# Patient Record
Sex: Female | Born: 1970 | Race: White | Hispanic: No | Marital: Married | Smoking: Never smoker
Health system: Southern US, Community
[De-identification: ages and names within clinical notes are randomized; demographics above are authoritative.]

## PROBLEM LIST (undated history)

## (undated) DIAGNOSIS — B019 Varicella without complication: Secondary | ICD-10-CM

## (undated) DIAGNOSIS — T7840XA Allergy, unspecified, initial encounter: Secondary | ICD-10-CM

## (undated) HISTORY — DX: Varicella without complication: B01.9

## (undated) HISTORY — DX: Allergy, unspecified, initial encounter: T78.40XA

---

## 2002-01-29 ENCOUNTER — Other Ambulatory Visit: Admission: RE | Admit: 2002-01-29 | Discharge: 2002-01-29 | Payer: Self-pay | Admitting: Gynecology

## 2003-02-10 ENCOUNTER — Other Ambulatory Visit: Admission: RE | Admit: 2003-02-10 | Discharge: 2003-02-10 | Payer: Self-pay | Admitting: Gynecology

## 2003-10-27 ENCOUNTER — Inpatient Hospital Stay (HOSPITAL_COMMUNITY): Admission: AD | Admit: 2003-10-27 | Discharge: 2003-10-31 | Payer: Self-pay | Admitting: Gynecology

## 2003-10-28 ENCOUNTER — Encounter (INDEPENDENT_AMBULATORY_CARE_PROVIDER_SITE_OTHER): Payer: Self-pay | Admitting: *Deleted

## 2003-12-07 ENCOUNTER — Other Ambulatory Visit: Admission: RE | Admit: 2003-12-07 | Discharge: 2003-12-07 | Payer: Self-pay | Admitting: Gynecology

## 2005-07-23 ENCOUNTER — Other Ambulatory Visit: Admission: RE | Admit: 2005-07-23 | Discharge: 2005-07-23 | Payer: Self-pay | Admitting: Gynecology

## 2005-12-03 ENCOUNTER — Encounter: Admission: RE | Admit: 2005-12-03 | Discharge: 2005-12-03 | Payer: Self-pay | Admitting: Gynecology

## 2006-01-24 ENCOUNTER — Inpatient Hospital Stay (HOSPITAL_COMMUNITY): Admission: RE | Admit: 2006-01-24 | Discharge: 2006-01-27 | Payer: Self-pay | Admitting: Gynecology

## 2006-01-24 ENCOUNTER — Encounter (INDEPENDENT_AMBULATORY_CARE_PROVIDER_SITE_OTHER): Payer: Self-pay | Admitting: Specialist

## 2006-03-05 ENCOUNTER — Other Ambulatory Visit: Admission: RE | Admit: 2006-03-05 | Discharge: 2006-03-05 | Payer: Self-pay | Admitting: Gynecology

## 2009-08-10 ENCOUNTER — Ambulatory Visit (HOSPITAL_COMMUNITY): Admission: RE | Admit: 2009-08-10 | Discharge: 2009-08-10 | Payer: Self-pay | Admitting: Obstetrics and Gynecology

## 2009-08-15 ENCOUNTER — Ambulatory Visit (HOSPITAL_COMMUNITY): Admission: AD | Admit: 2009-08-15 | Discharge: 2009-08-15 | Payer: Self-pay | Admitting: Obstetrics and Gynecology

## 2009-08-18 ENCOUNTER — Ambulatory Visit (HOSPITAL_COMMUNITY): Admission: RE | Admit: 2009-08-18 | Discharge: 2009-08-18 | Payer: Self-pay | Admitting: Obstetrics and Gynecology

## 2009-08-22 ENCOUNTER — Ambulatory Visit (HOSPITAL_COMMUNITY): Admission: RE | Admit: 2009-08-22 | Discharge: 2009-08-22 | Payer: Self-pay | Admitting: Obstetrics and Gynecology

## 2009-08-25 ENCOUNTER — Ambulatory Visit (HOSPITAL_COMMUNITY): Admission: RE | Admit: 2009-08-25 | Discharge: 2009-08-25 | Payer: Self-pay | Admitting: Obstetrics and Gynecology

## 2009-08-29 ENCOUNTER — Ambulatory Visit (HOSPITAL_COMMUNITY): Admission: RE | Admit: 2009-08-29 | Discharge: 2009-08-29 | Payer: Self-pay | Admitting: Obstetrics and Gynecology

## 2009-08-31 ENCOUNTER — Encounter (INDEPENDENT_AMBULATORY_CARE_PROVIDER_SITE_OTHER): Payer: Self-pay | Admitting: Obstetrics and Gynecology

## 2009-08-31 ENCOUNTER — Inpatient Hospital Stay (HOSPITAL_COMMUNITY): Admission: RE | Admit: 2009-08-31 | Discharge: 2009-09-03 | Payer: Self-pay | Admitting: Obstetrics and Gynecology

## 2010-05-14 ENCOUNTER — Encounter: Payer: Self-pay | Admitting: Obstetrics and Gynecology

## 2010-07-11 LAB — BASIC METABOLIC PANEL
CO2: 24 mEq/L (ref 19–32)
Calcium: 9.3 mg/dL (ref 8.4–10.5)
Chloride: 104 mEq/L (ref 96–112)
Creatinine, Ser: 0.64 mg/dL (ref 0.4–1.2)
GFR calc non Af Amer: >60 mL/min (ref >60)
Glucose, Bld: 106 mg/dL — ABNORMAL HIGH (ref 70–99)
Potassium: 4 mEq/L (ref 3.5–5.1)

## 2010-07-11 LAB — CBC
Hemoglobin: 9.7 g/dL — ABNORMAL LOW (ref 12.0–15.0)
MCHC: 34.7 g/dL (ref 30.0–36.0)
MCHC: 34.8 g/dL (ref 30.0–36.0)
MCV: 86.3 fL (ref 78.0–100.0)
MCV: 87.4 fL (ref 78.0–100.0)
Platelets: 158 10*3/uL (ref 150–400)
RBC: 3.18 MIL/uL — ABNORMAL LOW (ref 3.87–5.11)

## 2010-07-11 LAB — TYPE AND SCREEN: Antibody Screen: NEGATIVE

## 2010-09-08 NOTE — Op Note (Signed)
NAME:  Caitlin Potter, Caitlin Potter                           ACCOUNT NO.:  [id number]   MEDICAL RECORD NO.:  [id number]                   PATIENT TYPE:  INP   LOCATION:  9119                                 FACILITY:  [facility]   PHYSICIAN:  Ivor Costa. Farrel Gobble, M.D.              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   POSTOPERATIVE DIAGNOSES:  Cord prolapse and LOT arrest.   PROCEDURE:  Stat primary cesarean section low flap transverse.   SURGEON:  Ivor Costa. Farrel Gobble, M.D.   ANESTHESIA:  Epidural IV fluid, 1 liter lactated Ringer's.   ESTIMATED BLOOD LOSS:  400 mL.   URINE OUTPUT:  175 mL of blood tinged urine.   FINDINGS:  Viable female infant, Apgar 8 & 9, birth weight pending, pH  arterial was clotted, pH venous was 733, normal uterus, tubes and ovaries.   PATHOLOGY:  Placenta.   COMPLICATIONS:  None.   DESCRIPTION OF PROCEDURE:  The patient had been taken to the operating room  for a primary cesarean section for an LOT arrest that was deep in the  pelvis.  Once adequate anesthesia was ensured, I came in in order to elevate  the head in order to aid in delivery via the cesarean section. The head was  able to be elevated; however, lateral to the head I felt the cord,  pulsations were not felt by me.  The heart rate had been reactive prior to  the patient coming into the OR.  A doptone was placed on, a heart rate of  105 was auscultated; however, because no pulsations were felt by me, we went  ahead rapidly prepped her by putting Betadine on the belly, wiping it off  and then placing the drape and proceeding with the cesarean section.  A  Pfannenstiel skin incision was made with the scalpel, carried through the  underlying layer of fascia which was scored in the midline and extended  laterally with the Mayo scissors.  The  inferior aspect of the fascial  incision was grasped with the Kocher's, the underlying rectus muscles were  dissected off by blunt and sharp dissection.  In a similar fashion, the  superior aspect of the incision was grasped with Kocher's, the underlying  rectus muscles were dissected off, the rectus muscles were separated in the  midline, the peritoneum was entered bluntly and then extended laterally. The  bladder blade was inserted, the vesicouterine peritoneum was identified,  tinted up and entered sharply with the Metzenbaum scissors. The incision was  extended laterally, the bladder flap was created digitally, bladder blade  was then reinserted in the lower uterine and incised in a transverse fashion  with a scalpel. The infant was able to be delivered with the aid of [place].  The cord was cut and clamped and handed off to the waiting pediatrician,  spontaneous cry and respiration was noted.  Cord arterial pH was attempted;  however, very little blood was achieved but was sent. Cord venous pH was  also done. Cord bloods were then obtained. The placenta was allowed to  separate naturally, the uterus was cleared of all clots and debris. The  uterine incision was repaired with a running locked layer of #0 chromic and  noted to be hemostatic afterwards. The pelvis was then irrigated with  copious amounts of warm saline. The adnexa were inspected, the incision was  noted to be hemostatic. The urine at this point was noted to have some blood  in it.  We never dropped the peritoneum inferiorly because of the stat  nature of the cesarean section. The Foley balloon was palpated approximately  5 or 6 cm below the peritoneal incision and it was felt to be secondary to  trauma. The peritoneum muscles and fascia were inspected and noted to be  hemostatic. The fascia was closed with #0 Vicryl in a running fashion, subcu  was irrigated, the skin was closed with staples. A pressure dressing was   placed.  The patient tolerated the procedure well. Sponge, sharp and needle  counts were correct x2.  She was transferred to the PACU in stable  condition.                                               Ivor Costa. Farrel Gobble, M.D.    Leda Roys  D:  10/28/2003  T:  10/28/2003  Job:  [id number]

## 2010-09-08 NOTE — Discharge Summary (Signed)
NAME:  [name], [name]                           ACCOUNT NO.:  [id number]   MEDICAL RECORD NO.:  [id number]                   PATIENT TYPE:  INP   LOCATION:  9119                                 [facility]ACILITY:  [facility]   PHYSICIAN:  Ivor Costa. Farrel Gobble, M.D.              DATE OF BIRTH:  1970/07/06   DATE OF ADMISSION:  10/27/2003  DATE OF DISCHARGE:  10/31/2003                                 DISCHARGE SUMMARY   PRINCIPAL DIAGNOSIS:  Primary cesarean section.   ADDITIONAL DIAGNOSES:  1. Cord prolapse.  2. Transverse arrest.   HOSPITAL COURSE:  The patient was admitted in the evening of October 27, 2003  complaining of low back pain and cramping.  She was noted to be 3, 80, and -  1 in the office and was transferred to L&D.  Her contractions were every 2-6  minutes.  She rapidly progressed to fully dilated in due fashion and began  pushing.  At the point when she was pushing approximately 2 hours the nurses  had called for assessment and I had noted that there was no change in  station.  Exam at that point showed that she was direct LOT at a +1 to +2  presentation.  We decided to proceed with a primary cesarean section.  The  patient was ultimately taken to the OR and anesthetized adequately for her  cesarean section, at which point I placed my hand in the vagina in order to  elevate the fetal head to aid in cesarean delivery, at which point a cord  prolapse occurred.  At that point no pulsations were noted.  I was able to  raise the head a little higher and push the cord up and the patient then  underwent a STAT primary cesarean section, low flap transverse, under  epidural anesthesia.  The findings was a viable female with Apgars of 8 and  9 and a birth weight of 7 pounds even.  A cord pH arterial clotted; however,  venous was 7.33.  The tubes and ovaries were unremarkable and the placenta  was sent to pathology.  Her postoperative course was unremarkable.  She had  requested and was discharged home  on postoperative day #3.  She was  ambulating without difficulty and she was given a prescription for Darvocet-  N 100 and Motrin for postoperative pain and instructed to follow up with [place]  in the office afterwards.  Her postoperative hemoglobin was 11.2 with  hematocrit of 33.7 and platelets of 198.                                               Ivor Costa. Farrel Gobble, M.D.    THL/MEDQ  D:  12/02/2003  T:  12/02/2003  Job:  [id number]

## 2010-09-08 NOTE — H&P (Signed)
NAME:  Caitlin Potter, Caitlin Potter               ACCOUNT NO.:  [id number]   MEDICAL RECORD NO.:  [id number]          PATIENT TYPE:  INP   LOCATION:  NA                            [facility]   PHYSICIAN:  Juan H. Lily Peer, M.D.DATE OF BIRTH:  12-14-1970   DATE OF ADMISSION:  DATE OF DISCHARGE:                                HISTORY & PHYSICAL   CHIEF COMPLAINT:  1. Term intrauterine pregnancy.  2. Previous cesarean section, for elective repeat.  3. Gestational diabetes, diet controlled.   HISTORY:  The patient is a 40 year old, gravida 2, para 1, with a corrected  estimated date of confinement February 04, 2006.  The patient will be 38-1/2  weeks as admitting gestational age at the time of planned repeat cesarean  section.  The patient with prior history of cesarean section in 2005 and  elected to proceed with an elective repeat instead of a trial of labor.  The  patient had a normal first trimester screening alpha-fetoprotein and  declines cystic fibrosis testing and has had otherwise an uneventful  prenatal course with the exception of gestational diabetes, which was diet  controlled.  The patient was followed with serial nonstress tests on a  weekly basis since [redacted] weeks gestation with a reassuring fetal heart rate  tracing, and her last ultrasound September 27, fetus was in the vertex  presentation and estimated fetal weight was 6 pounds 8 ounces in the 30th  percentile and normal AFI.   PAST MEDICAL HISTORY:  The patient with prior cesarean section.  The patient  with history of gestational diabetes, diet controlled.   ALLERGIES:  CODEINE and SULFA.   REVIEW OF SYSTEMS:  See [place] form.   PHYSICAL EXAMINATION:  Blood pressure 110/68.  Urine was negative for  protein and glucose.  Weight 154 pounds.  HEENT:  Unremarkable.  NECK:  Supple.  Trachea midline.  No carotid bruits.  No thyromegaly.  LUNGS:  Clear to auscultation without any rhonchi or wheezes.  HEART:  Regular rate and  rhythm.  No murmurs or gallops.  BREASTS:  Not done.  ABDOMEN:  Soft and nontender without rebound or guarding.  Vertex  presentation by [facility] maneuver.  PELVIC:  Cervix 1 cm, 70% effaced, -3 station.  EXTREMITIES:  DTR 1+.  No clonus.   PRENATAL LABS:  A positive blood type.  Negative antibody screening.  VDRL  was nonreactive.  Rubella immune.  Hepatitis B surface antigen and HIV were  negative.  Alpha-fetoprotein was normal.  Abnormal diabetes screen.  GBS  culture was negative.   ASSESSMENT:  A 40 year old, gravida 2, para 1, at 87 and 5/[redacted] week gestation  with previous cesarean section requesting elective repeat.  Gestational  diabetic, diet controlled.  Normal antepartum testing.  Risks, benefits, and  pros and cons of repeat cesarean section were discussed with the patient to  include infection, bleeding, trauma to internal organ, potential need for  blood products with a potential risk of anaphylactic hepatitis, and these  were discussed.  Also the risk of deep venous thrombosis and pulmonary  embolism, and also in a  rare event the possibility of uncontrollable  hemorrhage, and she will need a total hysterectomy as a life saving measure.  All of these issues were discussed with the patient.  All questions were  answered and will follow accordingly.   PLAN:  The patient scheduled for a repeat cesarean section on Thursday,  October 4 at 9:30 a.m. at [facility].  Please have history and physical  available.      Juan H. Lily Peer, M.D.  Electronically Signed     JHF/MEDQ  D:  01/23/2006  T:  01/23/2006  Job:  [id number]

## 2010-09-08 NOTE — Op Note (Signed)
NAME:  Caitlin Potter, Caitlin Potter               ACCOUNT NO.:  [id number]   MEDICAL RECORD NO.:  [id number]          PATIENT TYPE:  INP   LOCATION:  9198                          FACILITY:  [facility]   PHYSICIAN:  Juan H. Lily Peer, M.D.DATE OF BIRTH:  December 14, 1970   DATE OF PROCEDURE:  01/24/2006  DATE OF DISCHARGE:                                 OPERATIVE REPORT   INDICATIONS FOR OPERATION:  A 40 year old gravida 2, para 1, at 38-1/2 weeks  as main gestational age.  Gestational diabetic, diet control.  Previous  cesarean section in 2005.  Patient elected to proceed with a repeat cesarean  section.  ACOG guidelines had previously been discussed.  Risks, benefits,  and pro's and con's of trial labor of C-section had been discussed.   PREOPERATIVE DIAGNOSIS:  1. Term intrauterine pregnancy.  2. Gestational diabetes, diet control.  3. Previous cesarean section, requesting elective repeat.   POSTOPERATIVE DIAGNOSES:  1. Term intrauterine pregnancy.  2. Gestational diabetes, diet control.  3. Previous cesarean section, requesting elective repeat.   ANESTHESIA:  Spinal.   SURGEON:  Juan H. Lily Peer, M.D.   FIRST ASSISTANT:  Timothy P. Fontaine, M.D.   PROCEDURE PERFORMED:  Repeat lower uterine segment transverse cesarean  section.   FINDINGS:  Viable female infant, nuchal cord x2, clear amniotic fluid.  Apgars  are 9 and 9.  Arterial core pH was 7.34.  Weight is 7 pounds, 8 ounces.  Normal maternal pelvic anatomy.   DESCRIPTION OF OPERATION:  After the patient was adequately counseled, she  was taken to the operating room, where she underwent a successful spinal  placement.  Foley catheter was inserted and after monitoring urinary output,  the abdomen was prepped and draped in the usual sterile fashion.  A  Pfannenstiel skin incision was made 2 cm above the symphysis pubis.  The  incision was carried down through the skin and subcutaneous tissue, down to  the rectus fascia.  A midline nick was  made.  The fascia was incised in a  transverse fashion.  The midline raphe was entered.  The peritoneal cavity  was entered cautiously.  A bladder flap was established.  The lower uterine  segment was incised in a transverse fashion.  Clear amniotic fluid was  present.  The newborn's head was delivered.  The nuchal cord was manually  reduced.  It was around the baby's neck x2 and loose.  The nasopharyngeal  area was bulb-suctioned.  The newborn was delivered.  The cord was doubly  clamped and excised.  The newborn gave an immediate cry, was shown to the  parents and passed off to the neonatologist, who was in attendance, who gave  the above-mentioned parameters.  After cord blood was obtained, the placenta  and cord were passed off the operative field for histological evaluation.  The uterus was then exteriorized.  The intrauterine cavity was felt clear of  any products of conception.  The uterus was closed in a double locking  stitch fashion with 0 Vicryl followed by a second layer of 0 Vicryl suture.  The uterus was placed back in  the pelvic cavity.  The pelvic cavity was  copiously irrigated with normal saline solution.  Sponge count and needle  count were correct.  The perineum was not reapproximated but  the rectus  fascia was closed with a running stitch of 0 Vicryl suture.  Subcutaneous  bleeders were Bovie cauterized.  The skin was reapproximated with skin  clips, followed by placement of Xeroform gauze and 4x8  dressing.  The  patient was transferred to the recovery room with stable vital signs.  Blood  loss was reported at 600 cc, IV fluids 3000 cc, and urine output was 100 cc.      Juan H. Lily Peer, M.D.  Electronically Signed     JHF/MEDQ  D:  01/24/2006  T:  01/25/2006  Job:  [id number]

## 2010-09-08 NOTE — Discharge Summary (Signed)
NAME:  Caitlin Potter, Caitlin Potter               ACCOUNT NO.:  [id number]   MEDICAL RECORD NO.:  [id number]          PATIENT TYPE:  INP   LOCATION:  9109                          [facility]AC[facility]   PHYSICIAN:  Timothy P. Fontaine, M.D.DATE OF BIRTH:  04/14/1971   DATE OF ADMISSION:  01/24/2006  DATE OF DISCHARGE:  01/27/2006                                 DISCHARGE SUMMARY   DISCHARGE DIAGNOSES:  1. Pregnancy at term.  2. Prior cesarean section.  3. For repeat cesarean section.   PROCEDURE:  Repeat low transverse cervical cesarean section January 24, 2006,  Dr. Reynaldo Minium. Pathology pending.   HOSPITAL COURSE:  A 40 year old G5, P42 female at term gestation admitted for  repeat cesarean section with history of prior cesarean section. The patient  underwent uncomplicated repeat low transverse cervical cesarean section  January 24, 2006 producing normal female infant, Apgars 9 and 9. The patient's  postoperative course was uncomplicated. She was discharged on postoperative  day #3 ambulating well, tolerating a regular diet with a postoperative  hemoglobin of 10.8. The patient's blood type is AB positive, and rubella  titer was equivocal. The patient will receive a rubella vaccine prior to  discharge. She received precautions, instructions, and follow-up. Will be  seen in the office in six weeks. Received a prescription for Tylox #30, 1-2  p.o. q.4-6 h. p.r.n. pain.      Timothy P. Fontaine, M.D.  Electronically Signed     TPF/MEDQ  D:  01/27/2006  T:  01/28/2006  Job:  [id number]

## 2012-02-11 ENCOUNTER — Other Ambulatory Visit: Payer: Self-pay | Admitting: Obstetrics and Gynecology

## 2012-02-11 DIAGNOSIS — Z1231 Encounter for screening mammogram for malignant neoplasm of breast: Secondary | ICD-10-CM

## 2012-02-26 ENCOUNTER — Ambulatory Visit: Payer: Self-pay

## 2012-03-11 ENCOUNTER — Ambulatory Visit: Payer: Self-pay

## 2012-07-24 ENCOUNTER — Other Ambulatory Visit: Payer: Self-pay

## 2012-07-24 ENCOUNTER — Other Ambulatory Visit: Payer: Self-pay | Admitting: Pediatrics

## 2012-07-24 DIAGNOSIS — M542 Cervicalgia: Secondary | ICD-10-CM

## 2013-05-22 ENCOUNTER — Other Ambulatory Visit: Payer: Self-pay

## 2013-05-22 DIAGNOSIS — Z1231 Encounter for screening mammogram for malignant neoplasm of breast: Secondary | ICD-10-CM

## 2013-05-25 ENCOUNTER — Ambulatory Visit
Admission: RE | Admit: 2013-05-25 | Discharge: 2013-05-25 | Disposition: A | Discharge status: Home / Self Care | Payer: Self-pay | Source: Ambulatory Visit

## 2013-05-25 DIAGNOSIS — Z1231 Encounter for screening mammogram for malignant neoplasm of breast: Secondary | ICD-10-CM

## 2013-08-27 LAB — RESULTS CONSOLE HPV: CHL HPV: NEGATIVE

## 2013-12-21 ENCOUNTER — Emergency Department (HOSPITAL_BASED_OUTPATIENT_CLINIC_OR_DEPARTMENT_OTHER)
Admission: EM | Admit: 2013-12-21 | Discharge: 2013-12-21 | Disposition: A | Discharge status: Home / Self Care | Payer: BC Managed Care – PPO | Attending: Emergency Medicine | Admitting: Emergency Medicine

## 2013-12-21 ENCOUNTER — Emergency Department (HOSPITAL_BASED_OUTPATIENT_CLINIC_OR_DEPARTMENT_OTHER): Payer: BC Managed Care – PPO

## 2013-12-21 ENCOUNTER — Encounter (HOSPITAL_BASED_OUTPATIENT_CLINIC_OR_DEPARTMENT_OTHER): Payer: Self-pay | Admitting: Emergency Medicine

## 2013-12-21 DIAGNOSIS — R1310 Dysphagia, unspecified: Secondary | ICD-10-CM

## 2013-12-21 DIAGNOSIS — R6889 Other general symptoms and signs: Secondary | ICD-10-CM | POA: Insufficient documentation

## 2013-12-21 NOTE — Discharge Instructions (Signed)
RETURN TO THE ED IF THERE IS ANY INABILITY TO SWALLOW, SEVERE PAIN OR NEW CONCERN.

## 2013-12-21 NOTE — ED Provider Notes (Signed)
CSN: [id number]     Arrival date & time 12/21/13  1731 History   First MD Initiated Contact with Patient 12/21/13 1838     Chief Complaint  Patient presents with  . Foreign Body     (Consider location/radiation/quality/duration/timing/severity/associated sxs/prior Treatment) Patient is a 43 y.o. female presenting with foreign body. The history is provided by the patient. No language interpreter was used.  Foreign Body Location:  Swallowed Suspected object:  Food Pain quality:  Sharp Associated symptoms: no cough and no trouble swallowing   Associated symptoms comment:  She was eating fish last night and feels there is a probable fish bone in her throat. No cough or oropharyngeal bleeding. She has been able to eat and drink throughout today. She reports the pain she feels is sharp and seems to move in her throat to different locations.    History reviewed. No pertinent past medical history. Past Surgical History  Procedure Laterality Date  . Cesarean section     No family history on file. History  Substance Use Topics  . Smoking status: Never Smoker   . Smokeless tobacco: Not on file  . Alcohol Use: No   OB History   Grav Para Term Preterm Abortions TAB SAB Ect Mult Living                 Review of Systems  HENT: Negative for trouble swallowing.        See HPI.  Respiratory: Negative for cough and shortness of breath.       Allergies  Vicodin  Home Medications   Prior to Admission medications   Not on File   BP 118/83  Pulse 78  Temp(Src) 98.3 F (36.8 C) (Oral)  Resp 16  Ht  (1.727 m)  Wt 135 lb (61.236 kg)  BMI 20.53 kg/m2  SpO2 100% Physical Exam  Constitutional: She is oriented to person, place, and time. She appears well-developed and well-nourished. No distress.  HENT:  Mouth/Throat: Oropharynx is clear and moist.  Neck: Normal range of motion. Neck supple. No tracheal deviation present. No thyromegaly present.  Pulmonary/Chest: Effort  normal.  Lymphadenopathy:    She has no cervical adenopathy.  Neurological: She is alert and oriented to person, place, and time.    ED Course  Procedures (including critical care time) Labs Review Labs Reviewed - No data to display  Imaging Review Dg Neck Soft Tissue  12/21/2013   CLINICAL DATA:  Eight fish last night. Feels like a fish bone is stuck in the neck.  EXAM: NECK SOFT TISSUES - 1+ VIEW  COMPARISON:  None.  FINDINGS: There is no evidence of retropharyngeal soft tissue swelling or epiglottic enlargement. The cervical airway is unremarkable and no radio-opaque foreign body identified. No evidence of a retained fishbone.  IMPRESSION: Negative.   Electronically Signed   By: Amie Portland M.D.   On: 12/21/2013 19:05     EKG Interpretation None      MDM   Final diagnoses:  None    1. Odynophagia  No apparent compromise with swallowing or breathing. Will refer to ENT for further management.     Arnoldo Hooker, PA-C 12/21/13 1931

## 2013-12-21 NOTE — ED Notes (Signed)
She feels like she has a fish bone stuck in her throat.

## 2013-12-23 NOTE — ED Provider Notes (Signed)
Medical screening examination/treatment/procedure(s) were performed by non-physician practitioner and as supervising physician I was immediately available for consultation/collaboration.    Jemal Miskell L Eiman Maret, MD 12/23/13 2344 

## 2014-02-17 ENCOUNTER — Ambulatory Visit: Payer: BC Managed Care – PPO | Admitting: Family

## 2014-03-09 ENCOUNTER — Ambulatory Visit: Payer: BC Managed Care – PPO | Admitting: Family

## 2014-03-22 ENCOUNTER — Ambulatory Visit: Payer: BC Managed Care – PPO | Admitting: Family

## 2016-06-14 DIAGNOSIS — L255 Unspecified contact dermatitis due to plants, except food: Secondary | ICD-10-CM | POA: Diagnosis not present

## 2017-09-01 DIAGNOSIS — J029 Acute pharyngitis, unspecified: Secondary | ICD-10-CM | POA: Diagnosis not present

## 2017-12-24 ENCOUNTER — Ambulatory Visit (INDEPENDENT_AMBULATORY_CARE_PROVIDER_SITE_OTHER): Payer: BLUE CROSS/BLUE SHIELD | Admitting: Nurse Practitioner

## 2017-12-24 ENCOUNTER — Encounter: Payer: Self-pay | Admitting: Nurse Practitioner

## 2017-12-24 VITALS — BP 120/78 | HR 78 | Temp 98.1°F | Ht 69.0 in | Wt 144.0 lb

## 2017-12-24 DIAGNOSIS — Z13220 Encounter for screening for lipoid disorders: Secondary | ICD-10-CM | POA: Diagnosis not present

## 2017-12-24 DIAGNOSIS — Z0000 Encounter for general adult medical examination without abnormal findings: Secondary | ICD-10-CM

## 2017-12-24 DIAGNOSIS — Z136 Encounter for screening for cardiovascular disorders: Secondary | ICD-10-CM | POA: Diagnosis not present

## 2017-12-24 LAB — CBC
HEMATOCRIT: 41.4 % (ref 36.0–46.0)
Hemoglobin: 14 g/dL (ref 12.0–15.0)
MCHC: 33.8 g/dL (ref 30.0–36.0)
MCV: 90.3 fl (ref 78.0–100.0)
Platelets: 201 10*3/uL (ref 150.0–400.0)
RBC: 4.58 Mil/uL (ref 3.87–5.11)
RDW: 12.7 % (ref 11.5–15.5)
WBC: 4.8 10*3/uL (ref 4.0–10.5)

## 2017-12-24 LAB — COMPREHENSIVE METABOLIC PANEL
ALT: 9 U/L (ref 0–35)
AST: 10 U/L (ref 0–37)
Albumin: 4.8 g/dL (ref 3.5–5.2)
Alkaline Phosphatase: 44 U/L (ref 39–117)
BUN: 13 mg/dL (ref 6–23)
CHLORIDE: 101 meq/L (ref 96–112)
CO2: 27 mEq/L (ref 19–32)
Calcium: 9.6 mg/dL (ref 8.4–10.5)
Creatinine, Ser: 0.76 mg/dL (ref 0.40–1.20)
GFR: 87.08 mL/min (ref >60.00)
GLUCOSE: 96 mg/dL (ref 70–99)
POTASSIUM: 4.6 meq/L (ref 3.5–5.1)
SODIUM: 137 meq/L (ref 135–145)
Total Bilirubin: 0.6 mg/dL (ref 0.2–1.2)
Total Protein: 7 g/dL (ref 6.0–8.3)

## 2017-12-24 LAB — LIPID PANEL
Cholesterol: 205 mg/dL — ABNORMAL HIGH (ref 0–200)
HDL: 67.7 mg/dL (ref >39.00)
LDL CALC: 124 mg/dL — AB (ref 0–99)
NONHDL: 137.06
Total CHOL/HDL Ratio: 3
Triglycerides: 67 mg/dL (ref 0.0–149.0)
VLDL: 13.4 mg/dL (ref 0.0–40.0)

## 2017-12-24 LAB — TSH: TSH: 1.42 u[IU]/mL (ref 0.35–4.50)

## 2017-12-24 NOTE — Patient Instructions (Addendum)
Please have recent PAP and mammogram results faxed to [place].  Please bring copy of Advance directive.  Encourage use of skin screen and wide brim hat when outside.  Normal CBC, TSH, and CMP Lipid panel indicates mild elevation in total cholesterol and LDL. In absence to hypertension, diabetes, and tobacco use; you are at low risk of cardiovascular event. Therefore I recommend DASH diet and staying active to improve lipid panel. We will repeat labs in 1year (fasting)  Health Maintenance, Female Adopting a healthy lifestyle and getting preventive care can go a long way to promote health and wellness. Talk with your health care provider about what schedule of regular examinations is right for you. This is a good chance for you to check in with your provider about disease prevention and staying healthy. In between checkups, there are plenty of things you can do on your own. Experts have done a lot of research about which lifestyle changes and preventive measures are most likely to keep you healthy. Ask your health care provider for more information. Weight and diet Eat a healthy diet  Be sure to include plenty of vegetables, fruits, low-fat dairy products, and lean protein.  Do not eat a lot of foods high in solid fats, added sugars, or salt.  Get regular exercise. This is one of the most important things you can do for your health. ? Most adults should exercise for at least 150 minutes each week. The exercise should increase your heart rate and make you sweat (moderate-intensity exercise). ? Most adults should also do strengthening exercises at least twice a week. This is in addition to the moderate-intensity exercise.  Maintain a healthy weight  Body mass index (BMI) is a measurement that can be used to identify possible weight problems. It estimates body fat based on height and weight. Your health care provider can help determine your BMI and help you achieve or maintain a healthy  weight.  For females 46 years of age and older: ? A BMI below 18.5 is considered underweight. ? A BMI of 18.5 to 24.9 is normal. ? A BMI of 25 to 29.9 is considered overweight. ? A BMI of 30 and above is considered obese.  Watch levels of cholesterol and blood lipids  You should start having your blood tested for lipids and cholesterol at 46 years of age, then have this test every 5 years.  You may need to have your cholesterol levels checked more often if: ? Your lipid or cholesterol levels are high. ? You are older than 46 years of age. ? You are at high risk for heart disease.  Cancer screening Lung Cancer  Lung cancer screening is recommended for adults 46-62 years old who are at high risk for lung cancer because of a history of smoking.  A yearly low-dose CT scan of the lungs is recommended for people who: ? Currently smoke. ? Have quit within the past 15 years. ? Have at least a 30-pack-year history of smoking. A pack year is smoking an average of one pack of cigarettes a day for 1 year.  Yearly screening should continue until it has been 15 years since you quit.  Yearly screening should stop if you develop a health problem that would prevent you from having lung cancer treatment.  Breast Cancer  Practice breast self-awareness. This means understanding how your breasts normally appear and feel.  It also means doing regular breast self-exams. Let your health care provider know about any changes, no matter  how small.  If you are in your 46s or 30s, you should have a clinical breast exam (CBE) by a health care provider every 1-3 years as part of a regular health exam.  If you are 46 or older, have a CBE every year. Also consider having a breast X-ray (mammogram) every year.  If you have a family history of breast cancer, talk to your health care provider about genetic screening.  If you are at high risk for breast cancer, talk to your health care provider about having an  MRI and a mammogram every year.  Breast cancer gene (BRCA) assessment is recommended for women who have family members with BRCA-related cancers. BRCA-related cancers include: ? Breast. ? Ovarian. ? Tubal. ? Peritoneal cancers.  Results of the assessment will determine the need for genetic counseling and BRCA1 and BRCA2 testing.  Cervical Cancer Your health care provider may recommend that you be screened regularly for cancer of the pelvic organs (ovaries, uterus, and vagina). This screening involves a pelvic examination, including checking for microscopic changes to the surface of your cervix (Pap test). You may be encouraged to have this screening done every 3 years, beginning at age 46.  For women ages 49-65, health care providers may recommend pelvic exams and Pap testing every 3 years, or they may recommend the Pap and pelvic exam, combined with testing for human papilloma virus (HPV), every 5 years. Some types of HPV increase your risk of cervical cancer. Testing for HPV may also be done on women of any age with unclear Pap test results.  Other health care providers may not recommend any screening for nonpregnant women who are considered low risk for pelvic cancer and who do not have symptoms. Ask your health care provider if a screening pelvic exam is right for you.  If you have had past treatment for cervical cancer or a condition that could lead to cancer, you need Pap tests and screening for cancer for at least 20 years after your treatment. If Pap tests have been discontinued, your risk factors (such as having a new sexual partner) need to be reassessed to determine if screening should resume. Some women have medical problems that increase the chance of getting cervical cancer. In these cases, your health care provider may recommend more frequent screening and Pap tests.  Colorectal Cancer  This type of cancer can be detected and often prevented.  Routine colorectal cancer screening  usually begins at 46 years of age and continues through 46 years of age.  Your health care provider may recommend screening at an earlier age if you have risk factors for colon cancer.  Your health care provider may also recommend using home test kits to check for hidden blood in the stool.  A small camera at the end of a tube can be used to examine your colon directly (sigmoidoscopy or colonoscopy). This is done to check for the earliest forms of colorectal cancer.  Routine screening usually begins at age 7.  Direct examination of the colon should be repeated every 5-10 years through 46 years of age. However, you may need to be screened more often if early forms of precancerous polyps or small growths are found.  Skin Cancer  Check your skin from head to toe regularly.  Tell your health care provider about any new moles or changes in moles, especially if there is a change in a mole's shape or color.  Also tell your health care provider if you have a  mole that is larger than the size of a pencil eraser.  Always use sunscreen. Apply sunscreen liberally and repeatedly throughout the day.  Protect yourself by wearing long sleeves, pants, a wide-brimmed hat, and sunglasses whenever you are outside.  Heart disease, diabetes, and high blood pressure  High blood pressure causes heart disease and increases the risk of stroke. High blood pressure is more likely to develop in: ? People who have blood pressure in the high end of the normal range (130-139/85-89 mm Hg). ? People who are overweight or obese. ? People who are African American.  If you are 46-65 years of age, have your blood pressure checked every 3-5 years. If you are 28 years of age or older, have your blood pressure checked every year. You should have your blood pressure measured twice-once when you are at a hospital or clinic, and once when you are not at a hospital or clinic. Record the average of the two measurements. To check  your blood pressure when you are not at a hospital or clinic, you can use: ? An automated blood pressure machine at a pharmacy. ? A home blood pressure monitor.  If you are between 54 years and 60 years old, ask your health care provider if you should take aspirin to prevent strokes.  Have regular diabetes screenings. This involves taking a blood sample to check your fasting blood sugar level. ? If you are at a normal weight and have a low risk for diabetes, have this test once every three years after 46 years of age. ? If you are overweight and have a high risk for diabetes, consider being tested at a younger age or more often. Preventing infection Hepatitis B  If you have a higher risk for hepatitis B, you should be screened for this virus. You are considered at high risk for hepatitis B if: ? You were born in a country where hepatitis B is common. Ask your health care provider which countries are considered high risk. ? Your parents were born in a high-risk country, and you have not been immunized against hepatitis B (hepatitis B vaccine). ? You have HIV or AIDS. ? You use needles to inject street drugs. ? You live with someone who has hepatitis B. ? You have had sex with someone who has hepatitis B. ? You get hemodialysis treatment. ? You take certain medicines for conditions, including cancer, organ transplantation, and autoimmune conditions.  Hepatitis C  Blood testing is recommended for: ? Everyone born from [birth year] through 1964. ? Anyone with known risk factors for hepatitis C.  Sexually transmitted infections (STIs)  You should be screened for sexually transmitted infections (STIs) including gonorrhea and chlamydia if: ? You are sexually active and are younger than 46 years of age. ? You are older than 46 years of age and your health care provider tells you that you are at risk for this type of infection. ? Your sexual activity has changed since you were last screened and you  are at an increased risk for chlamydia or gonorrhea. Ask your health care provider if you are at risk.  If you do not have HIV, but are at risk, it may be recommended that you take a prescription medicine daily to prevent HIV infection. This is called pre-exposure prophylaxis (PrEP). You are considered at risk if: ? You are sexually active and do not regularly use condoms or know the HIV status of your partner(s). ? You take drugs by injection. ? You  are sexually active with a partner who has HIV.  Talk with your health care provider about whether you are at high risk of being infected with HIV. If you choose to begin PrEP, you should first be tested for HIV. You should then be tested every 3 months for as long as you are taking PrEP. Pregnancy  If you are premenopausal and you may become pregnant, ask your health care provider about preconception counseling.  If you may become pregnant, take 400 to 800 micrograms (mcg) of folic acid every day.  If you want to prevent pregnancy, talk to your health care provider about birth control (contraception). Osteoporosis and menopause  Osteoporosis is a disease in which the bones lose minerals and strength with aging. This can result in serious bone fractures. Your risk for osteoporosis can be identified using a bone density scan.  If you are 47 years of age or older, or if you are at risk for osteoporosis and fractures, ask your health care provider if you should be screened.  Ask your health care provider whether you should take a calcium or vitamin D supplement to lower your risk for osteoporosis.  Menopause may have certain physical symptoms and risks.  Hormone replacement therapy may reduce some of these symptoms and risks. Talk to your health care provider about whether hormone replacement therapy is right for you. Follow these instructions at home:  Schedule regular health, dental, and eye exams.  Stay current with your  immunizations.  Do not use any tobacco products including cigarettes, chewing tobacco, or electronic cigarettes.  If you are pregnant, do not drink alcohol.  If you are breastfeeding, limit how much and how often you drink alcohol.  Limit alcohol intake to no more than 1 drink per day for nonpregnant women. One drink equals 12 ounces of beer, 5 ounces of wine, or 1 ounces of hard liquor.  Do not use street drugs.  Do not share needles.  Ask your health care provider for help if you need support or information about quitting drugs.  Tell your health care provider if you often feel depressed.  Tell your health care provider if you have ever been abused or do not feel safe at home. This information is not intended to replace advice given to you by your health care provider. Make sure you discuss any questions you have with your health care provider. Document Released: 10/23/2010 Document Revised: 09/15/2015 Document Reviewed: 01/11/2015 Elsevier Interactive Patient Education  [employer].

## 2017-12-24 NOTE — Progress Notes (Signed)
Subjective:    Patient ID: Caitlin Potter, female    DOB: 02-25-1971, 47 y.o.   MRN: [id number]  Patient presents today for complete physical   HPI   denies any acute complains.  Sexual History (orientation,birth control, marital status, STD):married, sexually active, use of IUD-inserted 59yrs ago, managed by GYN: Dr. Ellyn Hack. Last seen 45yrs ago. Last menstrual cycle 2011 due to IUD insertion  Depression/Suicide: Depression screen [facility] 2/9 12/24/2017  Decreased Interest 0  Down, Depressed, Hopeless 0  PHQ - 2 Score 0   Vision:will schedule  Dental:done every 43month.  Immunizations: (TDAP, Hep C screen, Pneumovax, Influenza, zoster)  Health Maintenance  Topic Date Due  . Tetanus Vaccine  12/27/1990  . Pap Smear  12/26/1992  . Flu Shot  11/21/2017  . HIV Screening  12/25/2018*  *Topic was postponed. The date shown is not the original due date.   Diet:regular.  Weight:  Wt Readings from Last 3 Encounters:  12/24/17 144 lb (65.3 kg)  12/21/13 135 lb (61.2 kg)   Exercise:walking, running, farm work every day  Fall Risk: Fall Risk  12/24/2017  Falls in the past year? No   Advanced Directive: has one with husband listed as HCPOA.  Medications and allergies reiewed with patient and updated if appropriate.  There are no active problems to display for this patient.   Current Outpatient Medications on File Prior to Visit  Medication Sig Dispense Refill  . levonorgestrel (MIRENA, 52 MG,) 20 MCG/24HR IUD Mirena 20 mcg/24 hours (5 yrs) 52 mg intrauterine device  Take 1 device by intrauterine route.    . Multiple Vitamin (MULTI-VITAMIN PO) Multi Vitamin     No current facility-administered medications on file prior to visit.     Past Medical History:  Diagnosis Date  . Chicken pox     Past Surgical History:  Procedure Laterality Date  . CESAREAN SECTION  2005,2007,2011    Social History   Socioeconomic History  . Marital status: Married    Spouse name: Not on  file  . Number of children: 4  . Years of education: Not on file  . Highest education level: Not on file  Occupational History  . Not on file  Social Needs  . Financial resource strain: Not on file  . Food insecurity:    Worry: Not on file    Inability: Not on file  . Transportation needs:    Medical: Not on file    Non-medical: Not on file  Tobacco Use  . Smoking status: Never Smoker  . Smokeless tobacco: Never Used  Substance and Sexual Activity  . Alcohol use: Yes    Alcohol/week: 1.0 standard drinks    Types: 1 Glasses of wine per week    Comment: at night  . Drug use: No  . Sexual activity: Yes    Birth control/protection: IUD  Lifestyle  . Physical activity:    Days per week: 5 days    Minutes per session: 60 min  . Stress: Not on file  Relationships  . Social connections:    Talks on phone: Not on file    Gets together: Not on file    Attends religious service: Not on file    Active member of club or organization: Not on file    Attends meetings of clubs or organizations: Not on file    Relationship status: Not on file  Other Topics Concern  . Not on file  Social History Narrative  . Not on  file    Family History  Problem Relation Age of Onset  . Arthritis Mother   . Hyperlipidemia Mother   . Hypertension Mother   . Miscarriages / [place] Mother   . Cancer Father        lung cancer  . Hearing loss Father   . Hyperlipidemia Father   . Hypertension Father   . Kidney disease Father   . Stroke Father   . Hyperlipidemia Brother   . Hypertension Brother   . Asthma Daughter   . Asthma Son   . Arthritis Maternal Grandmother   . Stroke Maternal Grandmother   . Heart attack Maternal Grandfather   . Diabetes Paternal Grandmother   . Stroke Paternal Grandfather         Review of Systems  Constitutional: Negative.   HENT: Negative.   Eyes: Positive for blurred vision. Negative for double vision, photophobia, pain, discharge and redness.    Respiratory: Negative.   Cardiovascular: Negative.   Gastrointestinal: Negative.   Genitourinary: Negative.   Musculoskeletal: Negative.   Skin: Negative.   Neurological: Negative.   Psychiatric/Behavioral: Negative.     Objective:   Vitals:   12/24/17 0914  BP: 120/78  Pulse: 78  Temp: 98.1 F (36.7 C)  SpO2: 99%    Body mass index is 21.27 kg/m.   Physical Examination:  Physical Exam  Constitutional: She is oriented to person, place, and time. No distress.  HENT:  Right Ear: External ear normal.  Left Ear: External ear normal.  Nose: Nose normal.  Mouth/Throat: Oropharynx is clear and moist.  Eyes: Pupils are equal, round, and reactive to light. Conjunctivae and EOM are normal.  Neck: Normal range of motion. Neck supple. No thyromegaly present.  Cardiovascular: Normal rate, regular rhythm, normal heart sounds and intact distal pulses.  Pulmonary/Chest: Effort normal and breath sounds normal.  Abdominal: Soft. Bowel sounds are normal.  Genitourinary:  Genitourinary Comments: Breast and pelvic exam deferred to GYN  Musculoskeletal: Normal range of motion. She exhibits no edema.  Lymphadenopathy:    She has no cervical adenopathy.  Neurological: She is alert and oriented to person, place, and time.  Skin: Skin is warm and dry. No rash noted.  Psychiatric: She has a normal mood and affect. Her behavior is normal. Thought content normal.  Vitals reviewed.   ASSESSMENT and PLAN:  Caitlin Potter was seen today for establish care.  Diagnoses and all orders for this visit:  Preventative health care -     CBC -     TSH -     Comprehensive metabolic panel -     Lipid Profile  Encounter for lipid screening for cardiovascular disease -     Lipid Profile   No problem-specific Assessment & Plan notes found for this encounter.     Follow up: Return if symptoms worsen or fail to improve.  Alysia Penna, NP

## 2017-12-25 ENCOUNTER — Encounter: Payer: Self-pay | Admitting: Nurse Practitioner

## 2017-12-26 DIAGNOSIS — D225 Melanocytic nevi of trunk: Secondary | ICD-10-CM | POA: Diagnosis not present

## 2017-12-26 DIAGNOSIS — D2261 Melanocytic nevi of right upper limb, including shoulder: Secondary | ICD-10-CM | POA: Diagnosis not present

## 2017-12-26 DIAGNOSIS — L565 Disseminated superficial actinic porokeratosis (DSAP): Secondary | ICD-10-CM | POA: Diagnosis not present

## 2018-01-24 DIAGNOSIS — H524 Presbyopia: Secondary | ICD-10-CM | POA: Diagnosis not present

## 2018-01-24 DIAGNOSIS — H52223 Regular astigmatism, bilateral: Secondary | ICD-10-CM | POA: Diagnosis not present

## 2018-01-24 DIAGNOSIS — H5203 Hypermetropia, bilateral: Secondary | ICD-10-CM | POA: Diagnosis not present

## 2018-03-19 DIAGNOSIS — Z124 Encounter for screening for malignant neoplasm of cervix: Secondary | ICD-10-CM | POA: Diagnosis not present

## 2018-03-19 DIAGNOSIS — Z6820 Body mass index (BMI) 20.0-20.9, adult: Secondary | ICD-10-CM | POA: Diagnosis not present

## 2018-03-19 DIAGNOSIS — Z01419 Encounter for gynecological examination (general) (routine) without abnormal findings: Secondary | ICD-10-CM | POA: Diagnosis not present

## 2018-03-19 DIAGNOSIS — Z1389 Encounter for screening for other disorder: Secondary | ICD-10-CM | POA: Diagnosis not present

## 2018-03-19 LAB — RESULTS CONSOLE HPV: CHL HPV: NEGATIVE

## 2018-03-19 LAB — HM PAP SMEAR

## 2018-03-24 DIAGNOSIS — Z1231 Encounter for screening mammogram for malignant neoplasm of breast: Secondary | ICD-10-CM | POA: Diagnosis not present

## 2018-03-26 ENCOUNTER — Other Ambulatory Visit: Payer: Self-pay | Admitting: Obstetrics and Gynecology

## 2018-03-26 DIAGNOSIS — R928 Other abnormal and inconclusive findings on diagnostic imaging of breast: Secondary | ICD-10-CM

## 2018-03-27 ENCOUNTER — Ambulatory Visit
Admission: RE | Admit: 2018-03-27 | Discharge: 2018-03-27 | Disposition: A | Discharge status: Home / Self Care | Payer: BLUE CROSS/BLUE SHIELD | Source: Ambulatory Visit | Attending: Obstetrics and Gynecology | Admitting: Obstetrics and Gynecology

## 2018-03-27 ENCOUNTER — Other Ambulatory Visit: Payer: Self-pay | Admitting: Obstetrics and Gynecology

## 2018-03-27 DIAGNOSIS — N6310 Unspecified lump in the right breast, unspecified quadrant: Secondary | ICD-10-CM

## 2018-03-27 DIAGNOSIS — N6011 Diffuse cystic mastopathy of right breast: Secondary | ICD-10-CM | POA: Diagnosis not present

## 2018-03-27 DIAGNOSIS — N6314 Unspecified lump in the right breast, lower inner quadrant: Secondary | ICD-10-CM | POA: Diagnosis not present

## 2018-03-27 DIAGNOSIS — R922 Inconclusive mammogram: Secondary | ICD-10-CM | POA: Diagnosis not present

## 2018-03-27 DIAGNOSIS — R928 Other abnormal and inconclusive findings on diagnostic imaging of breast: Secondary | ICD-10-CM

## 2018-03-27 DIAGNOSIS — N6489 Other specified disorders of breast: Secondary | ICD-10-CM | POA: Diagnosis not present

## 2018-03-29 DIAGNOSIS — R928 Other abnormal and inconclusive findings on diagnostic imaging of breast: Secondary | ICD-10-CM | POA: Insufficient documentation

## 2018-06-24 ENCOUNTER — Encounter: Payer: Self-pay | Admitting: Nurse Practitioner

## 2018-06-24 ENCOUNTER — Ambulatory Visit: Payer: BLUE CROSS/BLUE SHIELD | Admitting: Nurse Practitioner

## 2018-06-24 VITALS — BP 118/82 | HR 69 | Temp 98.5°F | Ht 68.0 in | Wt 131.0 lb

## 2018-06-24 DIAGNOSIS — Z975 Presence of (intrauterine) contraceptive device: Secondary | ICD-10-CM | POA: Insufficient documentation

## 2018-06-24 DIAGNOSIS — Z8632 Personal history of gestational diabetes: Secondary | ICD-10-CM | POA: Insufficient documentation

## 2018-06-24 DIAGNOSIS — J039 Acute tonsillitis, unspecified: Secondary | ICD-10-CM

## 2018-06-24 DIAGNOSIS — O24419 Gestational diabetes mellitus in pregnancy, unspecified control: Secondary | ICD-10-CM | POA: Insufficient documentation

## 2018-06-24 DIAGNOSIS — J028 Acute pharyngitis due to other specified organisms: Secondary | ICD-10-CM | POA: Diagnosis not present

## 2018-06-24 LAB — POCT RAPID STREP A (OFFICE): Rapid Strep A Screen: NEGATIVE

## 2018-06-24 MED ORDER — AMOXICILLIN 875 MG PO TABS: 875.0000 mg | ORAL_TABLET | Freq: Two times a day (BID) | ORAL | 0 refills | Status: DC

## 2018-06-24 NOTE — Patient Instructions (Addendum)
Use warm salt water gaggle 2-3times a day as needed. Start amoxicillin today. May also use tylenol or ibuprofen for pain and fever. You will be contacted with throat culture results.  Tonsillitis  Tonsillitis is an infection of the throat that causes the tonsils to become red, tender, and swollen. Tonsils are tissues in the back of your throat. Each tonsil has crevices (crypts). Tonsils normally work to protect the body from infection. What are the causes? Sudden (acute) tonsillitis may be caused by a virus or bacteria, including streptococcal bacteria. Long-lasting (chronic) tonsillitis occurs when the crypts of the tonsils become filled with pieces of food and bacteria, which makes it easy for the tonsils to become repeatedly infected. Tonsillitis can be spread from person to person (is contagious). It may be spread by inhaling droplets that are released with coughing or sneezing. You may also come into contact with viruses or bacteria on surfaces, such as cups or utensils. What are the signs or symptoms? Symptoms of this condition include:  A sore throat. This may include trouble swallowing.  White patches on the tonsils.  Swollen tonsils.  Fever.  Headache.  Tiredness.  Loss of appetite.  Snoring during sleep when you did not snore before.  Small, foul-smelling, yellowish-white pieces of material (tonsilloliths) that you occasionally cough up or spit out. These can cause you to have bad breath. How is this diagnosed? This condition is diagnosed with a physical exam. Diagnosis can be confirmed with the results of lab tests, including a throat culture. How is this treated? Treatment for this condition depends on the cause, but usually focuses on treating the symptoms associated with it. Treatment may include:  Medicines to relieve pain and manage fever.  Steroid medicines to reduce swelling.  Antibiotic medicines if the condition is caused by bacteria. If attacks of  tonsillitis are severe and frequent, your health care provider may recommend surgery to remove the tonsils (tonsillectomy). Follow these instructions at home: Medicines  Take over-the-counter and prescription medicines only as told by your health care provider.  If you were prescribed an antibiotic medicine, take it as told by your health care provider. Do not stop taking the antibiotic even if you start to feel better. Eating and drinking  Drink enough fluid to keep your urine clear or pale yellow.  While your throat is sore, eat soft or liquid foods, such as sherbet, soups, or instant breakfast drinks.  Drink warm liquids.  Eat frozen ice pops. General instructions  Rest as much as possible and get plenty of sleep.  Gargle with a salt-water mixture 3-4 times a day or as needed. To make a salt-water mixture, completely dissolve -1 tsp of salt in 1 cup of warm water.  Wash your hands regularly with soap and water. If soap and water are not available, use hand sanitizer.  Do not share cups, bottles, or other utensils until your symptoms have gone away.  Do not smoke. This can help your symptoms and prevent the infection from coming back. If you need help quitting, ask your health care provider.  Keep all follow-up visits as told by your health care provider. This is important. Contact a health care provider if:  You notice large, tender lumps in your neck that were not there before.  You have a fever that does not go away after 2-3 days.  You develop a rash.  You cough up a green, yellow-brown, or bloody substance.  You cannot swallow liquids or food for 24 hours.  Only one of your tonsils is swollen. Get help right away if:  You develop any new symptoms, such as vomiting, severe headache, stiff neck, chest pain, trouble breathing, or trouble swallowing.  You have severe throat pain along with drooling or voice changes.  You have severe pain that is not controlled with  medicines.  You cannot fully open your mouth.  You develop redness, swelling, or severe pain anywhere in your neck. Summary  Tonsillitis is an infection of the throat that causes the tonsils to become red, tender, and swollen.  Tonsillitis may be caused by a virus or bacteria.  Rest as much as possible. Get plenty of sleep. This information is not intended to replace advice given to you by your health care provider. Make sure you discuss any questions you have with your health care provider. Document Released: 01/17/2005 Document Revised: 05/15/2016 Document Reviewed: 05/15/2016 Elsevier Interactive Patient Education  2019 [employer].

## 2018-06-24 NOTE — Progress Notes (Signed)
Subjective:  Patient ID: Caitlin Potter, female    DOB: [birth year]  Age: 48 y.o. MRN: [id number]  CC: Lymphadenopathy (7-10 days on-set, headaches/body aches/PND , No cong/cough, OTC tylenol , low grade fever)  Sore Throat   This is a new problem. The current episode started 1 to 4 weeks ago. The problem has been waxing and waning. The pain is worse on the right side. The maximum temperature recorded prior to her arrival was 100.4 - 100.9 F. The fever has been present for 1 to 2 days. The pain is moderate. Associated symptoms include swollen glands. Pertinent negatives include no congestion, coughing, ear discharge, ear pain, hoarse voice, plugged ear sensation, neck pain, shortness of breath, stridor, trouble swallowing or vomiting. She has had no exposure to strep or mono. She has tried acetaminophen and NSAIDs for the symptoms. The treatment provided significant relief.   Reviewed past Medical, Social and Family history today.  Outpatient Medications Prior to Visit  Medication Sig Dispense Refill  . levonorgestrel (MIRENA, 52 MG,) 20 MCG/24HR IUD Mirena 20 mcg/24 hours (5 yrs) 52 mg intrauterine device  Take 1 device by intrauterine route.    . Multiple Vitamin (MULTI-VITAMIN PO) Multi Vitamin     No facility-administered medications prior to visit.     ROS See HPI  Objective:  BP 118/82   Pulse 69   Temp 98.5 F (36.9 C) (Oral)   Ht 5' 8"  (1.727 m)   Wt 131 lb (59.4 kg)   SpO2 98%   BMI 19.92 kg/m   BP Readings from Last 3 Encounters:  06/24/18 118/82  12/24/17 120/78  12/21/13 118/83    Wt Readings from Last 3 Encounters:  06/24/18 131 lb (59.4 kg)  12/24/17 144 lb (65.3 kg)  12/21/13 135 lb (61.2 kg)    Physical Exam Vitals signs reviewed.  HENT:     Right Ear: Tympanic membrane, ear canal and external ear normal. No mastoid tenderness.     Left Ear: Tympanic membrane, ear canal and external ear normal. No mastoid tenderness.     Nose: Nose normal.   Mouth/Throat:     Pharynx: Oropharyngeal exudate and posterior oropharyngeal erythema present.     Tonsils: Tonsillar exudate present. No tonsillar abscesses. Swelling: 2+ on the right. 2+ on the left.  Neck:     Musculoskeletal: Normal range of motion and neck supple.     Thyroid: No thyroid mass, thyromegaly or thyroid tenderness.  Cardiovascular:     Rate and Rhythm: Normal rate.     Heart sounds: Normal heart sounds.  Pulmonary:     Effort: Pulmonary effort is normal.     Breath sounds: Normal breath sounds.  Lymphadenopathy:     Cervical: Cervical adenopathy present.     Right cervical: Superficial cervical adenopathy and deep cervical adenopathy present. No posterior cervical adenopathy.    Left cervical: Superficial cervical adenopathy and deep cervical adenopathy present. No posterior cervical adenopathy.  Neurological:     Mental Status: She is alert and oriented to person, place, and time.  Psychiatric:        Mood and Affect: Mood normal.        Behavior: Behavior normal.     Lab Results  Component Value Date   WBC 4.8 12/24/2017   HGB 14.0 12/24/2017   HCT 41.4 12/24/2017   PLT 201.0 12/24/2017   GLUCOSE 96 12/24/2017   CHOL 205 (H) 12/24/2017   TRIG 67.0 12/24/2017   HDL 67.70 12/24/2017  LDLCALC 124 (H) 12/24/2017   ALT 9 12/24/2017   AST 10 12/24/2017   NA 137 12/24/2017   K 4.6 12/24/2017   CL 101 12/24/2017   CREATININE 0.76 12/24/2017   BUN 13 12/24/2017   CO2 27 12/24/2017   TSH 1.42 12/24/2017    US Breast Ltd Uni Right Inc Axilla  Result Date: 03/27/2018 CLINICAL DATA:  Screening recall for asymmetry seen in the right breast on the MLO view only. EXAM: DIGITAL DIAGNOSTIC UNILATERAL RIGHT MAMMOGRAM WITH CAD AND TOMO RIGHT BREAST ULTRASOUND COMPARISON:  Previous exam(s). ACR Breast Density Category c: The breast tissue is heterogeneously dense, which may obscure small masses. FINDINGS: Additional tomograms were performed of the right breast. The  initially questioned asymmetry seen in the superior right breast on the MLO tomograms resolves on the additional imaging with findings compatible with overlapping fibroglandular tissue. There is an asymmetry in the far lower right breast that persists although is less conspicuous on the additional spot compression MLO tomograms. Mammographic images were processed with CAD. Targeted ultrasound of the superior right breast was performed. No suspicious masses or abnormality seen, only dense fibroglandular tissue identified. Targeted ultrasound of the lower right breast was performed demonstrating a hypoechoic mass with slight margin irregularity at 5 o'clock 4 cm from nipple measuring 0.8 x 0.3 x 0.7 cm. This could represent an area of fibrocystic change or prominent ducts, however cannot be clearly characterized as such. This is felt to correspond with the asymmetry seen in the right breast at mammography. No axillary lymphadenopathy identified. IMPRESSION: Indeterminate mass in the right breast the approximate 5 o'clock position. RECOMMENDATION: Ultrasound-guided biopsy of the mass in the right breast at the 5 o'clock position is recommended. This will be performed later this morning. I have discussed the findings and recommendations with the patient. Results were also provided in writing at the conclusion of the visit. If applicable, a reminder letter will be sent to the patient regarding the next appointment. BI-RADS CATEGORY  4: Suspicious. Electronically Signed   By: Everlean Alstrom M.D.   On: 03/27/2018 11:10   Mm Diag Breast Tomo Uni Right  Result Date: 03/27/2018 CLINICAL DATA:  Screening recall for asymmetry seen in the right breast on the MLO view only. EXAM: DIGITAL DIAGNOSTIC UNILATERAL RIGHT MAMMOGRAM WITH CAD AND TOMO RIGHT BREAST ULTRASOUND COMPARISON:  Previous exam(s). ACR Breast Density Category c: The breast tissue is heterogeneously dense, which may obscure small masses. FINDINGS: Additional  tomograms were performed of the right breast. The initially questioned asymmetry seen in the superior right breast on the MLO tomograms resolves on the additional imaging with findings compatible with overlapping fibroglandular tissue. There is an asymmetry in the far lower right breast that persists although is less conspicuous on the additional spot compression MLO tomograms. Mammographic images were processed with CAD. Targeted ultrasound of the superior right breast was performed. No suspicious masses or abnormality seen, only dense fibroglandular tissue identified. Targeted ultrasound of the lower right breast was performed demonstrating a hypoechoic mass with slight margin irregularity at 5 o'clock 4 cm from nipple measuring 0.8 x 0.3 x 0.7 cm. This could represent an area of fibrocystic change or prominent ducts, however cannot be clearly characterized as such. This is felt to correspond with the asymmetry seen in the right breast at mammography. No axillary lymphadenopathy identified. IMPRESSION: Indeterminate mass in the right breast the approximate 5 o'clock position. RECOMMENDATION: Ultrasound-guided biopsy of the mass in the right breast at the 5 o'clock position  is recommended. This will be performed later this morning. I have discussed the findings and recommendations with the patient. Results were also provided in writing at the conclusion of the visit. If applicable, a reminder letter will be sent to the patient regarding the next appointment. BI-RADS CATEGORY  4: Suspicious. Electronically Signed   By: Everlean Alstrom M.D.   On: 03/27/2018 11:10   Mm Clip Placement Right  Result Date: 03/27/2018 CLINICAL DATA:  Status post ultrasound-guided biopsy right breast mass 5 o'clock position EXAM: DIAGNOSTIC RIGHT MAMMOGRAM POST ULTRASOUND BIOPSY COMPARISON:  Previous exam(s). FINDINGS: Mammographic images were obtained following ultrasound guided biopsy of right breast mass 5 o'clock position. Ribbon  shaped marking clip in appropriate position. IMPRESSION: Appropriate position ribbon shaped marking clip status post ultrasound-guided biopsy right breast mass. Final Assessment: Post Procedure Mammograms for Marker Placement Electronically Signed   By: Lovey Newcomer M.D.   On: 03/27/2018 11:33   [place] Rt Breast Bx W Loc Dev 1st Lesion Img Bx Spec US Guide  Addendum Date: 03/28/2018   ADDENDUM REPORT: 03/28/2018 13:07 ADDENDUM: Pathology revealed FIBROCYSTIC CHANGES of the Right breast, 5 o'clock. This was found to be concordant by Dr. Lovey Newcomer. Pathology results were discussed with the patient by telephone. The patient reported doing well after the biopsy with tenderness at the site. Post biopsy instructions and care were reviewed and questions were answered. The patient was encouraged to call [facility] for any additional concerns. The patient was instructed to return for annual screening mammography. Pathology results reported by Terie Purser, RN on 03/28/2018. Electronically Signed   By: Lovey Newcomer M.D.   On: 03/28/2018 13:07   Result Date: 03/28/2018 CLINICAL DATA:  Patient with indeterminate mass right breast 5 o'clock position. EXAM: ULTRASOUND GUIDED RIGHT BREAST CORE NEEDLE BIOPSY COMPARISON:  Previous exam(s). FINDINGS: I met with the patient and we discussed the procedure of ultrasound-guided biopsy, including benefits and alternatives. We discussed the high likelihood of a successful procedure. We discussed the risks of the procedure, including infection, bleeding, tissue injury, clip migration, and inadequate sampling. Informed written consent was given. The usual time-out protocol was performed immediately prior to the procedure. Lesion quadrant: Lower inner quadrant Using sterile technique and 1% Lidocaine as local anesthetic, under direct ultrasound visualization, a 12 gauge spring-loaded device was used to perform biopsy of right breast mass 5 o'clock position using a  lateral approach. At the conclusion of the procedure a ribbon shaped tissue marker clip was deployed into the biopsy cavity. Follow up 2 view mammogram was performed and dictated separately. IMPRESSION: Ultrasound guided biopsy of right breast mass 5 o'clock position. No apparent complications. Electronically Signed: By: Lovey Newcomer M.D. On: 03/27/2018 11:32    Assessment & Plan:   Abril was seen today for lymphadenopathy.  Diagnoses and all orders for this visit:  Acute tonsillitis, unspecified etiology -     POCT rapid strep A -     Culture, Group A Strep -     amoxicillin (AMOXIL) 875 MG tablet; Take 1 tablet (875 mg total) by mouth 2 (two) times daily.  Acute pharyngitis due to other specified organisms -     amoxicillin (AMOXIL) 875 MG tablet; Take 1 tablet (875 mg total) by mouth 2 (two) times daily.   I am having Frankey Poot. Yeh start on amoxicillin. I am also having her maintain her Multiple Vitamin (MULTI-VITAMIN PO) and levonorgestrel.  Meds ordered this encounter  Medications  . amoxicillin (  AMOXIL) 875 MG tablet    Sig: Take 1 tablet (875 mg total) by mouth 2 (two) times daily.    Dispense:  20 tablet    Refill:  0    Order Specific Question:   Supervising Provider    Answer:   MATTHEWS, CODY [[id number]]    Problem List Items Addressed This Visit    None    Visit Diagnoses    Acute tonsillitis, unspecified etiology    -  Primary   Relevant Medications   amoxicillin (AMOXIL) 875 MG tablet   Other Relevant Orders   POCT rapid strep A (Completed)   Culture, Group A Strep   Acute pharyngitis due to other specified organisms       Relevant Medications   amoxicillin (AMOXIL) 875 MG tablet       Follow-up: No follow-ups on file.  Wilfred Lacy, NP

## 2018-06-26 LAB — CULTURE, GROUP A STREP
MICRO NUMBER: 269681
SPECIMEN QUALITY: ADEQUATE

## 2018-10-09 ENCOUNTER — Encounter: Payer: Self-pay | Admitting: Family Medicine

## 2018-10-09 ENCOUNTER — Telehealth (INDEPENDENT_AMBULATORY_CARE_PROVIDER_SITE_OTHER): Payer: BC Managed Care – PPO | Admitting: Family Medicine

## 2018-10-09 DIAGNOSIS — J039 Acute tonsillitis, unspecified: Secondary | ICD-10-CM | POA: Insufficient documentation

## 2018-10-09 MED ORDER — AMOXICILLIN 500 MG PO CAPS: 500.0000 mg | ORAL_CAPSULE | Freq: Two times a day (BID) | ORAL | 0 refills | Status: AC

## 2018-10-09 NOTE — Assessment & Plan Note (Signed)
-  Suspected strep, will cover empirically with amoxicillin 500mg  BID x10 days.  -Discussed if not improving by early next week she should be evaluated in office.   -Recommend warm salt water gargles and/or tylenol/ibuprofen for pain management.

## 2018-10-09 NOTE — Progress Notes (Signed)
Caitlin Potter - 48 y.o. female MRN [id number]  Date of birth: 04/03/1971   This visit type was conducted due to national recommendations for restrictions regarding the COVID-19 Pandemic (e.g. social distancing).  This format is felt to be most appropriate for this patient at this time.  All issues noted in this document were discussed and addressed.  No physical exam was performed (except for noted visual exam findings with Video Visits).  I discussed the limitations of evaluation and management by telemedicine and the availability of in person appointments. The patient expressed understanding and agreed to proceed.  I connected with@ on 10/09/18 at 11:00 AM EDT by a video enabled telemedicine application and verified that I am speaking with the correct person using two identifiers.  Interactive audio and video telecommunications were attempted between this provider and patient, however failed, due to patient having technical difficulties OR patient did not have access to video capability.  We continued and completed visit with audio only.    Patient Location: Home [address] [place] [id number]   Provider location:   Home office  Chief Complaint  Patient presents with   Sore Throat    painful swallowing, onset : 32 hrs , disrupted sleep, red/ uvula/tonsils swollen    HPI  Caitlin Potter is a 48 y.o. female who presents via audio/video conferencing for a telehealth visit today.  She has complaint of sore throat today.  Reports that symptoms started last night and woke up this morning with swollen tonsils and uvula.  It is quite painful for her to swallow.  She denies fever, chills, nausea or vomiting.  She has had mild headache.  She denies congestion, cough, shortness of breath/difficulty breathing, or rash.  She has not tried anything for treatment at home.  She did have another episode of tonsillitis in March 2020 and strep test was negative at that time.  She reports that this  episode is worse and feels different.      ROS:  A comprehensive ROS was completed and negative except as noted per HPI  Past Medical History:  Diagnosis Date   Chicken pox     Past Surgical History:  Procedure Laterality Date   CESAREAN SECTION  2005,2007,2011    Family History  Problem Relation Age of Onset   Arthritis Mother    Hyperlipidemia Mother    Hypertension Mother    Miscarriages / [place]    Cancer Father        lung cancer   Hearing loss Father    Hyperlipidemia Father    Hypertension Father    Kidney disease Father    Stroke Father    Hyperlipidemia Brother    Hypertension Brother    Asthma Daughter    Asthma Son    Arthritis Maternal Grandmother    Stroke Maternal Grandmother    Heart attack Maternal Grandfather    Diabetes Paternal Grandmother    Stroke Paternal Grandfather     Social History   Socioeconomic History   Marital status: Married    Spouse name: Not on file   Number of children: 4   Years of education: Not on file   Highest education level: Not on file  Occupational History   Not on file  Social Needs   Financial resource strain: Not on file   Food insecurity    Worry: Not on file    Inability: Not on file   Transportation needs    Medical: Not on  file    Non-medical: Not on file  Tobacco Use   Smoking status: Never Smoker   Smokeless tobacco: Never Used  Substance and Sexual Activity   Alcohol use: Yes    Alcohol/week: 1.0 standard drinks    Types: 1 Glasses of wine per week    Comment: at night   Drug use: No   Sexual activity: Yes    Birth control/protection: I.U.D.  Lifestyle   Physical activity    Days per week: 5 days    Minutes per session: 60 min   Stress: Not on file  Relationships   Social connections    Talks on phone: Not on file    Gets together: Not on file    Attends religious service: Not on file    Active member of club or organization: Not on file     Attends meetings of clubs or organizations: Not on file    Relationship status: Not on file   Intimate partner violence    Fear of current or ex partner: Not on file    Emotionally abused: Not on file    Physically abused: Not on file    Forced sexual activity: Not on file  Other Topics Concern   Not on file  Social History Narrative   Not on file     Current Outpatient Medications:    amoxicillin (AMOXIL) 500 MG capsule, Take 1 capsule (500 mg total) by mouth 2 (two) times daily for 10 days., Disp: 20 capsule, Rfl: 0   levonorgestrel (MIRENA, 52 MG,) 20 MCG/24HR IUD, Mirena 20 mcg/24 hours (5 yrs) 52 mg intrauterine device  Take 1 device by intrauterine route., Disp: , Rfl:    Multiple Vitamin (MULTI-VITAMIN PO), Multi Vitamin, Disp: , Rfl:   EXAM:  VITALS per patient if applicable: Temp (!) 44.8 F (36.3 C) (Oral) Comment: TAKEN @ HM   Ht 5\' 8"  (1.727 m)    Wt 135 lb (61.2 kg)    BMI 20.53 kg/m   GENERAL: alert, oriented, and in no acute distress   PSYCH/NEURO: pleasant and cooperative, no obvious depression or anxiety, speech and thought processing grossly intact  ASSESSMENT AND PLAN:  Discussed the following assessment and plan:  Tonsillitis -Suspected strep, will cover empirically with amoxicillin 500mg  BID x10 days.  -Discussed if not improving by early next week she should be evaluated in office.   -Recommend warm salt water gargles and/or tylenol/ibuprofen for pain management.        I discussed the assessment and treatment plan with the patient. The patient was provided an opportunity to ask questions and all were answered. The patient agreed with the plan and demonstrated an understanding of the instructions.   The patient was advised to call back or seek an in-person evaluation if the symptoms worsen or if the condition fails to improve as anticipated.     Luetta Nutting, DO

## 2018-10-16 ENCOUNTER — Other Ambulatory Visit: Payer: Self-pay

## 2018-10-16 ENCOUNTER — Telehealth: Payer: Self-pay | Admitting: *Deleted

## 2018-10-16 ENCOUNTER — Encounter: Payer: Self-pay | Admitting: Nurse Practitioner

## 2018-10-16 ENCOUNTER — Telehealth: Payer: BC Managed Care – PPO | Admitting: Nurse Practitioner

## 2018-10-16 ENCOUNTER — Ambulatory Visit: Payer: BC Managed Care – PPO | Admitting: Family Medicine

## 2018-10-16 ENCOUNTER — Encounter: Payer: Self-pay | Admitting: Family Medicine

## 2018-10-16 VITALS — BP 118/86 | HR 90 | Temp 98.5°F | Ht 68.0 in | Wt 125.0 lb

## 2018-10-16 DIAGNOSIS — J029 Acute pharyngitis, unspecified: Secondary | ICD-10-CM | POA: Diagnosis not present

## 2018-10-16 DIAGNOSIS — K1379 Other lesions of oral mucosa: Secondary | ICD-10-CM

## 2018-10-16 MED ORDER — PREDNISONE 20 MG PO TABS: 40.0000 mg | ORAL_TABLET | Freq: Every day | ORAL | 0 refills | Status: DC

## 2018-10-16 NOTE — Telephone Encounter (Signed)
Called pt to schedule covid testing but it does not ring and goes straight to voicemail and unable to leave message because mailbox is full.

## 2018-10-16 NOTE — Progress Notes (Signed)
Caitlin Potter is a 48 y.o. female  Chief Complaint  Patient presents with   Sore Throat    pt came in 7 days ago with swollen uvula, irritation/ given antibiotics has 3 days left/ pt states today she woke up her uvula was swollen again but not as bad as 1 week prior/ some irritation/ some nasal drainage, little diarrhea (pt states normal on antibiotic)    HPI: Caitlin Potter is a 48 y.o. female who is a patient of Wilfred Lacy who was last seen by Dr. Zigmund Daniel on 10/09/18 via virtual visit and diagnosed with tonsillitis and given Rx for amoxicillin. Pt has 3 days left of this Rx and thought she was getting better until this AM when she woke up with a "scratchy" throat and swollen uvula. She feels as though she is 95% better at this time. She is very anxious and "in [my] own head" worrying about having COVID-19 as she has never had uvular swelling in the past. She denies difficulty breathing, swallowing, speaking.  No fever, chills. Pt has checked temp daily and afebrile. No runny nose or nasal congestion. Some mild PND - improving. No facial pressure. Mild headache - 1 week ago for 2 days when she did not have her normal headache. No cough, SOB. Loose stool x 1-2 since starting amoxicillin. No blood in stool.  No sick contacts.  Pt does not snore, does not think she sleeps with her mouth open. Pt does not have chronic allergies.   Past Medical History:  Diagnosis Date   Chicken pox     Past Surgical History:  Procedure Laterality Date   CESAREAN SECTION  2005,2007,2011    Social History   Socioeconomic History   Marital status: Married    Spouse name: Not on file   Number of children: 4   Years of education: Not on file   Highest education level: Not on file  Occupational History   Not on file  Social Needs   Financial resource strain: Not on file   Food insecurity    Worry: Not on file    Inability: Not on file   Transportation needs    Medical: Not on file      Non-medical: Not on file  Tobacco Use   Smoking status: Never Smoker   Smokeless tobacco: Never Used  Substance and Sexual Activity   Alcohol use: Yes    Alcohol/week: 1.0 standard drinks    Types: 1 Glasses of wine per week    Comment: at night   Drug use: No   Sexual activity: Yes    Birth control/protection: I.U.D.  Lifestyle   Physical activity    Days per week: 5 days    Minutes per session: 60 min   Stress: Not on file  Relationships   Social connections    Talks on phone: Not on file    Gets together: Not on file    Attends religious service: Not on file    Active member of club or organization: Not on file    Attends meetings of clubs or organizations: Not on file    Relationship status: Not on file   Intimate partner violence    Fear of current or ex partner: Not on file    Emotionally abused: Not on file    Physically abused: Not on file    Forced sexual activity: Not on file  Other Topics Concern   Not on file  Social History Narrative  Not on file    Family History  Problem Relation Age of Onset   Arthritis Mother    Hyperlipidemia Mother    Hypertension Mother    Miscarriages / [place] Mother    Cancer Father        lung cancer   Hearing loss Father    Hyperlipidemia Father    Hypertension Father    Kidney disease Father    Stroke Father    Hyperlipidemia Brother    Hypertension Brother    Asthma Daughter    Asthma Son    Arthritis Maternal Grandmother    Stroke Maternal Grandmother    Heart attack Maternal Grandfather    Diabetes Paternal Grandmother    Stroke Paternal Grandfather       There is no immunization history on file for this patient.  Outpatient Encounter Medications as of 10/16/2018  Medication Sig   amoxicillin (AMOXIL) 500 MG capsule Take 1 capsule (500 mg total) by mouth 2 (two) times daily for 10 days.   levonorgestrel (MIRENA, 52 MG,) 20 MCG/24HR IUD Mirena 20 mcg/24 hours (5 yrs)  52 mg intrauterine device  Take 1 device by intrauterine route.   Multiple Vitamin (MULTI-VITAMIN PO) Multi Vitamin   No facility-administered encounter medications on file as of 10/16/2018.      ROS: Pertinent positives and negatives noted in HPI. Remainder of ROS non-contributory    Allergies  Allergen Reactions   Codeine Diarrhea    vomit   Sulfa Antibiotics Diarrhea   Vicodin [Hydrocodone-Acetaminophen]     vomiting    BP 118/86    Pulse 90    Temp 98.5 F (36.9 C) (Oral)    Ht 5\' 8"  (1.727 m)    Wt 125 lb (56.7 kg)    SpO2 99%    BMI 19.01 kg/m   Physical Exam  Constitutional: She is oriented to person, place, and time. She appears well-developed and well-nourished. No distress.  HENT:  Head: Normocephalic and atraumatic.  Right Ear: Tympanic membrane and ear canal normal.  Left Ear: Tympanic membrane and ear canal normal.  Nose: Nose normal.  Mouth/Throat: Mucous membranes are normal. No oropharyngeal exudate, posterior oropharyngeal edema or posterior oropharyngeal erythema.    Neck: Neck supple.  Pulmonary/Chest: Effort normal and breath sounds normal. No stridor. No respiratory distress. She has no wheezes. She has no rhonchi.  Lymphadenopathy:    She has no cervical adenopathy.  Neurological: She is alert and oriented to person, place, and time.     A/P:  1. Uvular edema 2. Sore throat - complete abx course - strep test not done as pt has been on abx x 7 days Rx: - predniSONE (DELTASONE) 20 MG tablet; Take 2 tablets (40 mg total) by mouth daily with breakfast.  Dispense: 10 tablet; Refill: 0 - zyrtec daily and also consider flonase to see if this can fully clear up PND which may e contributing to uvular swelling - pt is anxious and worried about COVID-19 infection. She requests and is pretty adamant about being tested so [facility] - f/u if symptoms worsen or do not improve in 7-10 adys

## 2018-10-16 NOTE — Telephone Encounter (Signed)
-----   Message from Ronnald Nian, DO sent at 10/16/2018  3:56 PM EDT ----- Regarding: COVID 19 testing Caitlin Potter 11/04/1970 MRN:  [id number]  [employer]: [id number]  [id number] with sore throat, uvular swelling, sporadic cough x 1+ week. Symptoms were improving but now worse. No known exposure to COVID + patient. Pt is requesting testing.  Please let me know if you need any more info.  Thanks, Dr. Bryan Lemma

## 2018-10-17 ENCOUNTER — Other Ambulatory Visit: Payer: BC Managed Care – PPO

## 2018-10-17 ENCOUNTER — Ambulatory Visit: Payer: Self-pay | Admitting: *Deleted

## 2018-10-17 ENCOUNTER — Telehealth: Payer: Self-pay | Admitting: *Deleted

## 2018-10-17 DIAGNOSIS — R6889 Other general symptoms and signs: Secondary | ICD-10-CM | POA: Diagnosis not present

## 2018-10-17 DIAGNOSIS — Z20822 Contact with and (suspected) exposure to covid-19: Secondary | ICD-10-CM

## 2018-10-17 NOTE — Telephone Encounter (Signed)
Pt called back and call returned to her to schedule for the covid-19 test. Scheduled for today at [facility] at 10:30. Advised to stay in car, wear a mask with windows rolled up until ready to be tested.  She voiced understanding.

## 2018-10-17 NOTE — Telephone Encounter (Signed)
See telephone encounter.

## 2018-10-17 NOTE — Telephone Encounter (Signed)
-----   Message from Mary K Cirigliano, DO sent at 10/16/2018  3:56 PM EDT ----- Regarding: COVID 19 testing Caitlin Potter 02/05/1971 MRN:  [id number]  [employer]: [id number]  47yoF with sore throat, uvular swelling, sporadic cough x 1+ week. Symptoms were improving but now worse. No known exposure to COVID + patient. Pt is requesting testing.  Please let me know if you need any more info.  Thanks, Dr. Cirigliano  

## 2018-10-17 NOTE — Telephone Encounter (Signed)
Left voicemail for patient to return call to schedule COVID 19 test.  Test ordered. 

## 2018-10-23 LAB — NOVEL CORONAVIRUS, NAA: SARS-CoV-2, NAA: NOT DETECTED

## 2018-11-05 DIAGNOSIS — S93492A Sprain of other ligament of left ankle, initial encounter: Secondary | ICD-10-CM | POA: Diagnosis not present

## 2018-12-31 DIAGNOSIS — D2261 Melanocytic nevi of right upper limb, including shoulder: Secondary | ICD-10-CM | POA: Diagnosis not present

## 2018-12-31 DIAGNOSIS — L565 Disseminated superficial actinic porokeratosis (DSAP): Secondary | ICD-10-CM | POA: Diagnosis not present

## 2018-12-31 DIAGNOSIS — D2262 Melanocytic nevi of left upper limb, including shoulder: Secondary | ICD-10-CM | POA: Diagnosis not present

## 2018-12-31 DIAGNOSIS — D225 Melanocytic nevi of trunk: Secondary | ICD-10-CM | POA: Diagnosis not present

## 2019-01-23 ENCOUNTER — Encounter: Payer: Self-pay | Admitting: Nurse Practitioner

## 2019-01-23 ENCOUNTER — Ambulatory Visit (INDEPENDENT_AMBULATORY_CARE_PROVIDER_SITE_OTHER): Payer: BC Managed Care – PPO | Admitting: Nurse Practitioner

## 2019-01-23 ENCOUNTER — Other Ambulatory Visit: Payer: Self-pay

## 2019-01-23 VITALS — BP 102/82 | HR 80 | Temp 98.2°F | Ht 68.0 in | Wt 128.4 lb

## 2019-01-23 DIAGNOSIS — Z13220 Encounter for screening for lipoid disorders: Secondary | ICD-10-CM

## 2019-01-23 DIAGNOSIS — K1379 Other lesions of oral mucosa: Secondary | ICD-10-CM

## 2019-01-23 DIAGNOSIS — Z136 Encounter for screening for cardiovascular disorders: Secondary | ICD-10-CM | POA: Diagnosis not present

## 2019-01-23 DIAGNOSIS — Z0000 Encounter for general adult medical examination without abnormal findings: Secondary | ICD-10-CM

## 2019-01-23 DIAGNOSIS — Z23 Encounter for immunization: Secondary | ICD-10-CM

## 2019-01-23 LAB — CBC
HCT: 41.5 % (ref 36.0–46.0)
Hemoglobin: 14.2 g/dL (ref 12.0–15.0)
MCHC: 34.2 g/dL (ref 30.0–36.0)
MCV: 87.7 fl (ref 78.0–100.0)
Platelets: 218 10*3/uL (ref 150.0–400.0)
RBC: 4.73 Mil/uL (ref 3.87–5.11)
RDW: 12.7 % (ref 11.5–15.5)
WBC: 4.5 10*3/uL (ref 4.0–10.5)

## 2019-01-23 LAB — COMPREHENSIVE METABOLIC PANEL
ALT: 10 U/L (ref 0–35)
AST: 12 U/L (ref 0–37)
Albumin: 4.8 g/dL (ref 3.5–5.2)
Alkaline Phosphatase: 41 U/L (ref 39–117)
BUN: 19 mg/dL (ref 6–23)
CO2: 30 mEq/L (ref 19–32)
Calcium: 10 mg/dL (ref 8.4–10.5)
Chloride: 103 mEq/L (ref 96–112)
Creatinine, Ser: 0.79 mg/dL (ref 0.40–1.20)
GFR: 77.98 mL/min (ref >60.00)
Glucose, Bld: 100 mg/dL — ABNORMAL HIGH (ref 70–99)
Potassium: 4.2 mEq/L (ref 3.5–5.1)
Sodium: 140 mEq/L (ref 135–145)
Total Bilirubin: 0.6 mg/dL (ref 0.2–1.2)
Total Protein: 7 g/dL (ref 6.0–8.3)

## 2019-01-23 LAB — LIPID PANEL
Cholesterol: 198 mg/dL (ref 0–200)
HDL: 64.1 mg/dL (ref >39.00)
LDL Cholesterol: 124 mg/dL — ABNORMAL HIGH (ref 0–99)
NonHDL: 133.5
Total CHOL/HDL Ratio: 3
Triglycerides: 46 mg/dL (ref 0.0–149.0)
VLDL: 9.2 mg/dL (ref 0.0–40.0)

## 2019-01-23 NOTE — Progress Notes (Signed)
Subjective:    Patient ID: Caitlin Potter, female    DOB: 07/25/1970, 48 y.o.   MRN: [id number]  Patient presents today for complete physical  HPI  Caitlin Potter denies any acute complaints today.  Sexual History (orientation,birth control, marital status, STD):married, sexually active, IUD in place, upcoming appt with GYN: Dr. Ellyn Potter for PAP. Will schedule next mammogram next month  Skin cancer screen: has annual exam by dermatology.  Depression/Suicide: [facility] 06/24/2018 12/24/2017  Decreased Interest 0 0  Down, Depressed, Hopeless 0 0  PHQ - 2 Score 0 0   Vision:up to date  Dental:up to date  Immunizations: (TDAP, Hep C screen, Pneumovax, Influenza, zoster)  Health Maintenance  Topic Date Due  . Pap Smear  12/26/1992  . Tetanus Vaccine  01/23/2020*  . HIV Screening  01/23/2020*  . Flu Shot  Completed  *Topic was postponed. The date shown is not the original due date.   Diet:heart healthy  Weight:  Wt Readings from Last 3 Encounters:  01/23/19 128 lb 6.4 oz (58.2 kg)  10/16/18 125 lb (56.7 kg)  10/16/18 135 lb (61.2 kg)    Exercise:walking daily  Fall Risk: Fall Risk  06/24/2018 12/24/2017  Falls in the past year? 0 No   Medications and allergies reviewed with patient and updated if appropriate.  Patient Active Problem List   Diagnosis Date Noted  . Tonsillitis 10/09/2018  . Intrauterine contraceptive device 06/24/2018  . Gestational diabetes mellitus 06/24/2018  . Abnormal finding on mammography 03/29/2018    Current Outpatient Medications on File Prior to Visit  Medication Sig Dispense Refill  . levonorgestrel (MIRENA, 52 MG,) 20 MCG/24HR IUD Mirena 20 mcg/24 hours (5 yrs) 52 mg intrauterine device  Take 1 device by intrauterine route.    . Multiple Vitamin (MULTI-VITAMIN PO) Multi Vitamin     No current facility-administered medications on file prior to visit.     Past Medical History:  Diagnosis Date  . Chicken pox     Past  Surgical History:  Procedure Laterality Date  . CESAREAN SECTION  2005,2007,2011    Social History   Socioeconomic History  . Marital status: Married    Spouse name: Not on file  . Number of children: 4  . Years of education: Not on file  . Highest education level: Not on file  Occupational History  . Not on file  Social Needs  . Financial resource strain: Not on file  . Food insecurity    Worry: Not on file    Inability: Not on file  . Transportation needs    Medical: Not on file    Non-medical: Not on file  Tobacco Use  . Smoking status: Never Smoker  . Smokeless tobacco: Never Used  Substance and Sexual Activity  . Alcohol use: Yes    Alcohol/week: 1.0 standard drinks    Types: 1 Glasses of wine per week    Comment: at night  . Drug use: No  . Sexual activity: Yes    Birth control/protection: I.U.D.  Lifestyle  . Physical activity    Days per week: 5 days    Minutes per session: 60 min  . Stress: Not on file  Relationships  . Social Musicianconnections    Talks on phone: Not on file    Gets together: Not on file    Attends religious service: Not on file    Active member of club or organization: Not on file    Attends meetings of  clubs or organizations: Not on file    Relationship status: Not on file  Other Topics Concern  . Not on file  Social History Narrative  . Not on file    Family History  Problem Relation Age of Onset  . Arthritis Mother   . Hyperlipidemia Mother   . Hypertension Mother   . Miscarriages / [place] Mother   . Cancer Father        lung cancer  . Hearing loss Father   . Hyperlipidemia Father   . Hypertension Father   . Kidney disease Father   . Stroke Father   . Hyperlipidemia Brother   . Hypertension Brother   . Asthma Daughter   . Asthma Son   . Arthritis Maternal Grandmother   . Stroke Maternal Grandmother   . Heart attack Maternal Grandfather   . Diabetes Paternal Grandmother   . Stroke Paternal Grandfather          Review of Systems  Constitutional: Negative for fever, malaise/fatigue and weight loss.  HENT: Negative for congestion and sore throat.   Eyes:       Negative for visual changes  Respiratory: Negative for cough and shortness of breath.   Cardiovascular: Negative for chest pain, palpitations and leg swelling.  Gastrointestinal: Negative for blood in stool, constipation, diarrhea and heartburn.  Genitourinary: Negative for dysuria, frequency and urgency.  Musculoskeletal: Negative for falls, joint pain and myalgias.  Skin: Negative for rash.  Neurological: Negative for dizziness, sensory change and headaches.  Endo/Heme/Allergies: Does not bruise/bleed easily.  Psychiatric/Behavioral: Negative for depression, substance abuse and suicidal ideas. The patient is not nervous/anxious.     Objective:   Vitals:   01/23/19 0915  BP: 102/82  Pulse: 80  Temp: 98.2 F (36.8 C)  SpO2: 99%   Body mass index is 19.52 kg/m.  Physical Examination:  Physical Exam Vitals signs reviewed.  Constitutional:      General: She is not in acute distress.    Appearance: She is well-developed and normal weight.  HENT:     Head: Normocephalic.     Right Ear: Tympanic membrane, ear canal and external ear normal.     Left Ear: Tympanic membrane, ear canal and external ear normal.     Nose: Nose normal.     Mouth/Throat:     Mouth: Mucous membranes are moist.     Pharynx: No oropharyngeal exudate or posterior oropharyngeal erythema.  Eyes:     Extraocular Movements: Extraocular movements intact.     Conjunctiva/sclera: Conjunctivae normal.  Neck:     Musculoskeletal: Normal range of motion and neck supple.  Cardiovascular:     Rate and Rhythm: Normal rate and regular rhythm.     Heart sounds: Normal heart sounds.  Pulmonary:     Effort: Pulmonary effort is normal. No respiratory distress.     Breath sounds: Normal breath sounds.  Chest:     Chest wall: No tenderness.  Abdominal:     General:  Abdomen is flat. Bowel sounds are normal.     Palpations: Abdomen is soft.  Genitourinary:    Comments: Deferred pelvic and breast exam to GYN Musculoskeletal: Normal range of motion.  Lymphadenopathy:     Cervical: No cervical adenopathy.  Skin:    General: Skin is warm and dry.     Findings: No rash.  Neurological:     Mental Status: She is alert and oriented to person, place, and time.     Deep Tendon Reflexes: Reflexes  are normal and symmetric.  Psychiatric:        Mood and Affect: Mood normal.        Behavior: Behavior normal.        Thought Content: Thought content normal.    ASSESSMENT and PLAN:  Maryem was seen today for annual exam.  Diagnoses and all orders for this visit:  Preventative health care -     CBC -     Comprehensive metabolic panel -     Lipid panel  Encounter for lipid screening for cardiovascular disease -     Lipid panel  Uvular edema  Need for influenza vaccination -     Flu Vaccine QUAD 36+ mos IM    No problem-specific Assessment & Plan notes found for this encounter.      Problem List Items Addressed This Visit    None    Visit Diagnoses    Preventative health care    -  Primary   Relevant Orders   CBC (Completed)   Comprehensive metabolic panel (Completed)   Lipid panel (Completed)   Encounter for lipid screening for cardiovascular disease       Relevant Orders   Lipid panel (Completed)   Uvular edema       Need for influenza vaccination       Relevant Orders   Flu Vaccine QUAD 36+ mos IM (Completed)      Follow up: Return in about 1 year (around 01/23/2020) for CPE (fasting).  Wilfred Lacy, NP

## 2019-01-23 NOTE — Patient Instructions (Signed)
Good to see you again.  Have updated mammogram and PAP results faxed to me when completed.  Go to lab for blood draw. Continue great job with exercise and healthy diet.  Health Maintenance, Female Adopting a healthy lifestyle and getting preventive care are important in promoting health and wellness. Ask your health care provider about:  The right schedule for you to have regular tests and exams.  Things you can do on your own to prevent diseases and keep yourself healthy. What should I know about diet, weight, and exercise? Eat a healthy diet   Eat a diet that includes plenty of vegetables, fruits, low-fat dairy products, and lean protein.  Do not eat a lot of foods that are high in solid fats, added sugars, or sodium. Maintain a healthy weight Body mass index (BMI) is used to identify weight problems. It estimates body fat based on height and weight. Your health care provider can help determine your BMI and help you achieve or maintain a healthy weight. Get regular exercise Get regular exercise. This is one of the most important things you can do for your health. Most adults should:  Exercise for at least 150 minutes each week. The exercise should increase your heart rate and make you sweat (moderate-intensity exercise).  Do strengthening exercises at least twice a week. This is in addition to the moderate-intensity exercise.  Spend less time sitting. Even light physical activity can be beneficial. Watch cholesterol and blood lipids Have your blood tested for lipids and cholesterol at 47 years of age, then have this test every 5 years. Have your cholesterol levels checked more often if:  Your lipid or cholesterol levels are high.  You are older than 47 years of age.  You are at high risk for heart disease. What should I know about cancer screening? Depending on your health history and family history, you may need to have cancer screening at various ages. This may include  screening for:  Breast cancer.  Cervical cancer.  Colorectal cancer.  Skin cancer.  Lung cancer. What should I know about heart disease, diabetes, and high blood pressure? Blood pressure and heart disease  High blood pressure causes heart disease and increases the risk of stroke. This is more likely to develop in people who have high blood pressure readings, are of African descent, or are overweight.  Have your blood pressure checked: ? Every 3-5 years if you are 58-47 years of age. ? Every year if you are 87 years old or older. Diabetes Have regular diabetes screenings. This checks your fasting blood sugar level. Have the screening done:  Once every three years after age 63 if you are at a normal weight and have a low risk for diabetes.  More often and at a younger age if you are overweight or have a high risk for diabetes. What should I know about preventing infection? Hepatitis B If you have a higher risk for hepatitis B, you should be screened for this virus. Talk with your health care provider to find out if you are at risk for hepatitis B infection. Hepatitis C Testing is recommended for:  Everyone born from [birth year] through 1964.  Anyone with known risk factors for hepatitis C. Sexually transmitted infections (STIs)  Get screened for STIs, including gonorrhea and chlamydia, if: ? You are sexually active and are younger than 47 years of age. ? You are older than 47 years of age and your health care provider tells you that you are at  risk for this type of infection. ? Your sexual activity has changed since you were last screened, and you are at increased risk for chlamydia or gonorrhea. Ask your health care provider if you are at risk.  Ask your health care provider about whether you are at high risk for HIV. Your health care provider may recommend a prescription medicine to help prevent HIV infection. If you choose to take medicine to prevent HIV, you should first get  tested for HIV. You should then be tested every 3 months for as long as you are taking the medicine. Pregnancy  If you are about to stop having your period (premenopausal) and you may become pregnant, seek counseling before you get pregnant.  Take 400 to 800 micrograms (mcg) of folic acid every day if you become pregnant.  Ask for birth control (contraception) if you want to prevent pregnancy. Osteoporosis and menopause Osteoporosis is a disease in which the bones lose minerals and strength with aging. This can result in bone fractures. If you are 55 years old or older, or if you are at risk for osteoporosis and fractures, ask your health care provider if you should:  Be screened for bone loss.  Take a calcium or vitamin D supplement to lower your risk of fractures.  Be given hormone replacement therapy (HRT) to treat symptoms of menopause. Follow these instructions at home: Lifestyle  Do not use any products that contain nicotine or tobacco, such as cigarettes, e-cigarettes, and chewing tobacco. If you need help quitting, ask your health care provider.  Do not use street drugs.  Do not share needles.  Ask your health care provider for help if you need support or information about quitting drugs. Alcohol use  Do not drink alcohol if: ? Your health care provider tells you not to drink. ? You are pregnant, may be pregnant, or are planning to become pregnant.  If you drink alcohol: ? Limit how much you use to 0-1 drink a day. ? Limit intake if you are breastfeeding.  Be aware of how much alcohol is in your drink. In the [place] one drink equals one 12 oz bottle of beer (355 mL), one 5 oz glass of wine (148 mL), or one 1 oz glass of hard liquor (44 mL). General instructions  Schedule regular health, dental, and eye exams.  Stay current with your vaccines.  Tell your health care provider if: ? You often feel depressed. ? You have ever been abused or do not feel safe at home.  Summary  Adopting a healthy lifestyle and getting preventive care are important in promoting health and wellness.  Follow your health care provider's instructions about healthy diet, exercising, and getting tested or screened for diseases.  Follow your health care provider's instructions on monitoring your cholesterol and blood pressure. This information is not intended to replace advice given to you by your health care provider. Make sure you discuss any questions you have with your health care provider. Document Released: 10/23/2010 Document Revised: 04/02/2018 Document Reviewed: 04/02/2018 Elsevier Patient Education  2020 [employer].

## 2019-03-30 DIAGNOSIS — Z130 Encounter for screening for diseases of the blood and blood-forming organs and certain disorders involving the immune mechanism: Secondary | ICD-10-CM | POA: Diagnosis not present

## 2019-03-30 DIAGNOSIS — Z01419 Encounter for gynecological examination (general) (routine) without abnormal findings: Secondary | ICD-10-CM | POA: Diagnosis not present

## 2019-03-30 DIAGNOSIS — Z681 Body mass index (BMI) 19 or less, adult: Secondary | ICD-10-CM | POA: Diagnosis not present

## 2019-03-30 DIAGNOSIS — Z1389 Encounter for screening for other disorder: Secondary | ICD-10-CM | POA: Diagnosis not present

## 2019-04-02 ENCOUNTER — Other Ambulatory Visit: Payer: Self-pay | Admitting: Obstetrics and Gynecology

## 2019-04-02 DIAGNOSIS — Z1231 Encounter for screening mammogram for malignant neoplasm of breast: Secondary | ICD-10-CM

## 2019-05-25 ENCOUNTER — Ambulatory Visit: Payer: BC Managed Care – PPO

## 2019-06-26 ENCOUNTER — Other Ambulatory Visit: Payer: Self-pay

## 2019-06-26 ENCOUNTER — Ambulatory Visit
Admission: RE | Admit: 2019-06-26 | Discharge: 2019-06-26 | Disposition: A | Discharge status: Home / Self Care | Payer: BC Managed Care – PPO | Source: Ambulatory Visit | Attending: Obstetrics and Gynecology | Admitting: Obstetrics and Gynecology

## 2019-06-26 DIAGNOSIS — Z1231 Encounter for screening mammogram for malignant neoplasm of breast: Secondary | ICD-10-CM

## 2019-06-26 LAB — HM MAMMOGRAPHY

## 2019-07-22 ENCOUNTER — Encounter: Payer: Self-pay | Admitting: Nurse Practitioner

## 2019-12-31 DIAGNOSIS — L565 Disseminated superficial actinic porokeratosis (DSAP): Secondary | ICD-10-CM | POA: Diagnosis not present

## 2019-12-31 DIAGNOSIS — L814 Other melanin hyperpigmentation: Secondary | ICD-10-CM | POA: Diagnosis not present

## 2019-12-31 DIAGNOSIS — D2262 Melanocytic nevi of left upper limb, including shoulder: Secondary | ICD-10-CM | POA: Diagnosis not present

## 2019-12-31 DIAGNOSIS — D225 Melanocytic nevi of trunk: Secondary | ICD-10-CM | POA: Diagnosis not present

## 2020-02-03 DIAGNOSIS — H524 Presbyopia: Secondary | ICD-10-CM | POA: Diagnosis not present

## 2020-02-03 DIAGNOSIS — H52223 Regular astigmatism, bilateral: Secondary | ICD-10-CM | POA: Diagnosis not present

## 2020-02-03 DIAGNOSIS — Z135 Encounter for screening for eye and ear disorders: Secondary | ICD-10-CM | POA: Diagnosis not present

## 2020-04-06 DIAGNOSIS — Z1389 Encounter for screening for other disorder: Secondary | ICD-10-CM | POA: Diagnosis not present

## 2020-04-06 DIAGNOSIS — Z130 Encounter for screening for diseases of the blood and blood-forming organs and certain disorders involving the immune mechanism: Secondary | ICD-10-CM | POA: Diagnosis not present

## 2020-04-06 DIAGNOSIS — Z6820 Body mass index (BMI) 20.0-20.9, adult: Secondary | ICD-10-CM | POA: Diagnosis not present

## 2020-04-06 DIAGNOSIS — N951 Menopausal and female climacteric states: Secondary | ICD-10-CM | POA: Insufficient documentation

## 2020-04-06 DIAGNOSIS — Z01419 Encounter for gynecological examination (general) (routine) without abnormal findings: Secondary | ICD-10-CM | POA: Diagnosis not present

## 2020-06-15 ENCOUNTER — Other Ambulatory Visit: Payer: Self-pay | Admitting: Obstetrics and Gynecology

## 2020-06-15 DIAGNOSIS — Z1231 Encounter for screening mammogram for malignant neoplasm of breast: Secondary | ICD-10-CM

## 2020-07-14 DIAGNOSIS — M5432 Sciatica, left side: Secondary | ICD-10-CM | POA: Diagnosis not present

## 2020-07-20 DIAGNOSIS — M5432 Sciatica, left side: Secondary | ICD-10-CM | POA: Diagnosis not present

## 2020-07-21 DIAGNOSIS — M5432 Sciatica, left side: Secondary | ICD-10-CM | POA: Diagnosis not present

## 2020-07-22 DIAGNOSIS — M5432 Sciatica, left side: Secondary | ICD-10-CM | POA: Diagnosis not present

## 2020-07-25 DIAGNOSIS — M5432 Sciatica, left side: Secondary | ICD-10-CM | POA: Diagnosis not present

## 2020-07-27 DIAGNOSIS — M5432 Sciatica, left side: Secondary | ICD-10-CM | POA: Diagnosis not present

## 2020-07-29 DIAGNOSIS — M5432 Sciatica, left side: Secondary | ICD-10-CM | POA: Diagnosis not present

## 2020-08-04 ENCOUNTER — Telehealth: Payer: Self-pay | Admitting: Family Medicine

## 2020-08-04 NOTE — Telephone Encounter (Signed)
Disregard last message, pt decided to go to something in [place] since she would possibly need to give a urine sample

## 2020-08-04 NOTE — Telephone Encounter (Signed)
Pt called and said she is in [place] right now on a trip and she is having UTI symptoms and wanted to see about doing a virtual visit. Would you be able see pt for a mychart video visit?

## 2020-08-09 DIAGNOSIS — M5432 Sciatica, left side: Secondary | ICD-10-CM | POA: Diagnosis not present

## 2020-08-10 DIAGNOSIS — M5432 Sciatica, left side: Secondary | ICD-10-CM | POA: Diagnosis not present

## 2020-08-11 DIAGNOSIS — M5432 Sciatica, left side: Secondary | ICD-10-CM | POA: Diagnosis not present

## 2020-08-15 DIAGNOSIS — M5432 Sciatica, left side: Secondary | ICD-10-CM | POA: Diagnosis not present

## 2020-08-17 DIAGNOSIS — M5432 Sciatica, left side: Secondary | ICD-10-CM | POA: Diagnosis not present

## 2020-08-22 DIAGNOSIS — M9903 Segmental and somatic dysfunction of lumbar region: Secondary | ICD-10-CM | POA: Diagnosis not present

## 2020-08-22 DIAGNOSIS — M62830 Muscle spasm of back: Secondary | ICD-10-CM | POA: Diagnosis not present

## 2020-08-22 DIAGNOSIS — M5137 Other intervertebral disc degeneration, lumbosacral region: Secondary | ICD-10-CM | POA: Diagnosis not present

## 2020-08-22 DIAGNOSIS — M5432 Sciatica, left side: Secondary | ICD-10-CM | POA: Diagnosis not present

## 2020-08-24 DIAGNOSIS — M5432 Sciatica, left side: Secondary | ICD-10-CM | POA: Diagnosis not present

## 2020-08-24 DIAGNOSIS — M62830 Muscle spasm of back: Secondary | ICD-10-CM | POA: Diagnosis not present

## 2020-08-24 DIAGNOSIS — M9903 Segmental and somatic dysfunction of lumbar region: Secondary | ICD-10-CM | POA: Diagnosis not present

## 2020-08-24 DIAGNOSIS — M5137 Other intervertebral disc degeneration, lumbosacral region: Secondary | ICD-10-CM | POA: Diagnosis not present

## 2020-08-26 DIAGNOSIS — M5432 Sciatica, left side: Secondary | ICD-10-CM | POA: Diagnosis not present

## 2020-08-29 DIAGNOSIS — M5432 Sciatica, left side: Secondary | ICD-10-CM | POA: Diagnosis not present

## 2020-08-31 DIAGNOSIS — M5432 Sciatica, left side: Secondary | ICD-10-CM | POA: Diagnosis not present

## 2020-09-02 DIAGNOSIS — M5432 Sciatica, left side: Secondary | ICD-10-CM | POA: Diagnosis not present

## 2020-09-05 DIAGNOSIS — M5432 Sciatica, left side: Secondary | ICD-10-CM | POA: Diagnosis not present

## 2020-09-07 DIAGNOSIS — M5432 Sciatica, left side: Secondary | ICD-10-CM | POA: Diagnosis not present

## 2020-09-09 ENCOUNTER — Ambulatory Visit: Payer: BC Managed Care – PPO

## 2020-09-12 DIAGNOSIS — M5432 Sciatica, left side: Secondary | ICD-10-CM | POA: Diagnosis not present

## 2020-09-13 DIAGNOSIS — M5432 Sciatica, left side: Secondary | ICD-10-CM | POA: Diagnosis not present

## 2020-09-28 DIAGNOSIS — M5432 Sciatica, left side: Secondary | ICD-10-CM | POA: Diagnosis not present

## 2020-11-07 ENCOUNTER — Ambulatory Visit
Admission: RE | Admit: 2020-11-07 | Discharge: 2020-11-07 | Disposition: A | Discharge status: Home / Self Care | Payer: BC Managed Care – PPO | Source: Ambulatory Visit | Attending: Obstetrics and Gynecology | Admitting: Obstetrics and Gynecology

## 2020-11-07 ENCOUNTER — Other Ambulatory Visit: Payer: Self-pay

## 2020-11-07 DIAGNOSIS — Z1231 Encounter for screening mammogram for malignant neoplasm of breast: Secondary | ICD-10-CM

## 2020-11-07 LAB — HM MAMMOGRAPHY

## 2021-01-04 DIAGNOSIS — L570 Actinic keratosis: Secondary | ICD-10-CM | POA: Diagnosis not present

## 2021-01-04 DIAGNOSIS — L814 Other melanin hyperpigmentation: Secondary | ICD-10-CM | POA: Diagnosis not present

## 2021-01-04 DIAGNOSIS — L565 Disseminated superficial actinic porokeratosis (DSAP): Secondary | ICD-10-CM | POA: Diagnosis not present

## 2021-01-04 DIAGNOSIS — D2272 Melanocytic nevi of left lower limb, including hip: Secondary | ICD-10-CM | POA: Diagnosis not present

## 2021-01-20 DIAGNOSIS — M7711 Lateral epicondylitis, right elbow: Secondary | ICD-10-CM | POA: Diagnosis not present

## 2021-01-31 ENCOUNTER — Encounter: Payer: Self-pay | Admitting: Nurse Practitioner

## 2021-01-31 DIAGNOSIS — M778 Other enthesopathies, not elsewhere classified: Secondary | ICD-10-CM

## 2021-01-31 HISTORY — DX: Other enthesopathies, not elsewhere classified: M77.8

## 2021-04-18 DIAGNOSIS — N951 Menopausal and female climacteric states: Secondary | ICD-10-CM | POA: Insufficient documentation

## 2021-04-18 DIAGNOSIS — Z130 Encounter for screening for diseases of the blood and blood-forming organs and certain disorders involving the immune mechanism: Secondary | ICD-10-CM | POA: Diagnosis not present

## 2021-04-18 DIAGNOSIS — Z1389 Encounter for screening for other disorder: Secondary | ICD-10-CM | POA: Diagnosis not present

## 2021-04-18 DIAGNOSIS — Z6822 Body mass index (BMI) 22.0-22.9, adult: Secondary | ICD-10-CM | POA: Diagnosis not present

## 2021-04-18 DIAGNOSIS — Z01419 Encounter for gynecological examination (general) (routine) without abnormal findings: Secondary | ICD-10-CM | POA: Diagnosis not present

## 2021-06-13 ENCOUNTER — Other Ambulatory Visit: Payer: Self-pay

## 2021-06-15 ENCOUNTER — Ambulatory Visit (INDEPENDENT_AMBULATORY_CARE_PROVIDER_SITE_OTHER): Payer: BC Managed Care – PPO | Admitting: Nurse Practitioner

## 2021-06-15 ENCOUNTER — Other Ambulatory Visit: Payer: Self-pay

## 2021-06-15 ENCOUNTER — Encounter: Payer: Self-pay | Admitting: Nurse Practitioner

## 2021-06-15 VITALS — BP 118/76 | HR 78 | Temp 97.7°F | Ht 68.5 in | Wt 141.4 lb

## 2021-06-15 DIAGNOSIS — Z136 Encounter for screening for cardiovascular disorders: Secondary | ICD-10-CM

## 2021-06-15 DIAGNOSIS — Z13220 Encounter for screening for lipoid disorders: Secondary | ICD-10-CM

## 2021-06-15 DIAGNOSIS — Z23 Encounter for immunization: Secondary | ICD-10-CM

## 2021-06-15 DIAGNOSIS — Z1211 Encounter for screening for malignant neoplasm of colon: Secondary | ICD-10-CM

## 2021-06-15 DIAGNOSIS — Z0000 Encounter for general adult medical examination without abnormal findings: Secondary | ICD-10-CM | POA: Diagnosis not present

## 2021-06-15 LAB — COMPREHENSIVE METABOLIC PANEL
ALT: 11 U/L (ref 0–35)
AST: 13 U/L (ref 0–37)
Albumin: 4.7 g/dL (ref 3.5–5.2)
Alkaline Phosphatase: 52 U/L (ref 39–117)
BUN: 20 mg/dL (ref 6–23)
CO2: 31 mEq/L (ref 19–32)
Calcium: 9.4 mg/dL (ref 8.4–10.5)
Chloride: 103 mEq/L (ref 96–112)
Creatinine, Ser: 0.89 mg/dL (ref 0.40–1.20)
GFR: 76.1 mL/min (ref >60.00)
Glucose, Bld: 97 mg/dL (ref 70–99)
Potassium: 3.9 mEq/L (ref 3.5–5.1)
Sodium: 138 mEq/L (ref 135–145)
Total Bilirubin: 0.5 mg/dL (ref 0.2–1.2)
Total Protein: 6.8 g/dL (ref 6.0–8.3)

## 2021-06-15 LAB — CBC
HCT: 40.2 % (ref 36.0–46.0)
Hemoglobin: 13.5 g/dL (ref 12.0–15.0)
MCHC: 33.7 g/dL (ref 30.0–36.0)
MCV: 87.9 fl (ref 78.0–100.0)
Platelets: 211 10*3/uL (ref 150.0–400.0)
RBC: 4.57 Mil/uL (ref 3.87–5.11)
RDW: 12.6 % (ref 11.5–15.5)
WBC: 4 10*3/uL (ref 4.0–10.5)

## 2021-06-15 LAB — LIPID PANEL
Cholesterol: 221 mg/dL — ABNORMAL HIGH (ref 0–200)
HDL: 70 mg/dL (ref >39.00)
LDL Cholesterol: 138 mg/dL — ABNORMAL HIGH (ref 0–99)
NonHDL: 150.81
Total CHOL/HDL Ratio: 3
Triglycerides: 63 mg/dL (ref 0.0–149.0)
VLDL: 12.6 mg/dL (ref 0.0–40.0)

## 2021-06-15 LAB — TSH: TSH: 1.12 u[IU]/mL (ref 0.35–5.50)

## 2021-06-15 NOTE — Patient Instructions (Addendum)
Please bring copy of living will and HCPOA forms  Go to lab for blood draw  Will request copy of recent PAP smear results  You will be contacted to schedule appt with GI for colonoscopy.  Preventive Care 11-50 Years Old, Female Preventive care refers to lifestyle choices and visits with your health care provider that can promote health and wellness. Preventive care visits are also called wellness exams. What can I expect for my preventive care visit? Counseling Your health care provider may ask you questions about your: Medical history, including: Past medical problems. Family medical history. Pregnancy history. Current health, including: Menstrual cycle. Method of birth control. Emotional well-being. Home life and relationship well-being. Sexual activity and sexual health. Lifestyle, including: Alcohol, nicotine or tobacco, and drug use. Access to firearms. Diet, exercise, and sleep habits. Work and work Statistician. Sunscreen use. Safety issues such as seatbelt and bike helmet use. Physical exam Your health care provider will check your: Height and weight. These may be used to calculate your BMI (body mass index). BMI is a measurement that tells if you are at a healthy weight. Waist circumference. This measures the distance around your waistline. This measurement also tells if you are at a healthy weight and may help predict your risk of certain diseases, such as type 2 diabetes and high blood pressure. Heart rate and blood pressure. Body temperature. Skin for abnormal spots. What immunizations do I need? Vaccines are usually given at various ages, according to a schedule. Your health care provider will recommend vaccines for you based on your age, medical history, and lifestyle or other factors, such as travel or where you work. What tests do I need? Screening Your health care provider may recommend screening tests for certain conditions. This may include: Lipid and  cholesterol levels. Diabetes screening. This is done by checking your blood sugar (glucose) after you have not eaten for a while (fasting). Pelvic exam and Pap test. Hepatitis B test. Hepatitis C test. HIV (human immunodeficiency virus) test. STI (sexually transmitted infection) testing, if you are at risk. Lung cancer screening. Colorectal cancer screening. Mammogram. Talk with your health care provider about when you should start having regular mammograms. This may depend on whether you have a family history of breast cancer. BRCA-related cancer screening. This may be done if you have a family history of breast, ovarian, tubal, or peritoneal cancers. Bone density scan. This is done to screen for osteoporosis. Talk with your health care provider about your test results, treatment options, and if necessary, the need for more tests. Follow these instructions at home: Eating and drinking  Eat a diet that includes fresh fruits and vegetables, whole grains, lean protein, and low-fat dairy products. Take vitamin and mineral supplements as recommended by your health care provider. Do not drink alcohol if: Your health care provider tells you not to drink. You are pregnant, may be pregnant, or are planning to become pregnant. If you drink alcohol: Limit how much you have to 0-1 drink a day. Know how much alcohol is in your drink. In the [place] one drink equals one 12 oz bottle of beer (355 mL), one 5 oz glass of wine (148 mL), or one 1 oz glass of hard liquor (44 mL). Lifestyle Brush your teeth every morning and night with fluoride toothpaste. Floss one time each day. Exercise for at least 30 minutes 5 or more days each week. Do not use any products that contain nicotine or tobacco. These products include cigarettes, chewing tobacco,  and vaping devices, such as e-cigarettes. If you need help quitting, ask your health care provider. Do not use drugs. If you are sexually active, practice safe sex.  Use a condom or other form of protection to prevent STIs. If you do not wish to become pregnant, use a form of birth control. If you plan to become pregnant, see your health care provider for a prepregnancy visit. Take aspirin only as told by your health care provider. Make sure that you understand how much to take and what form to take. Work with your health care provider to find out whether it is safe and beneficial for you to take aspirin daily. Find healthy ways to manage stress, such as: Meditation, yoga, or listening to music. Journaling. Talking to a trusted person. Spending time with friends and family. Minimize exposure to UV radiation to reduce your risk of skin cancer. Safety Always wear your seat belt while driving or riding in a vehicle. Do not drive: If you have been drinking alcohol. Do not ride with someone who has been drinking. When you are tired or distracted. While texting. If you have been using any mind-altering substances or drugs. Wear a helmet and other protective equipment during sports activities. If you have firearms in your house, make sure you follow all gun safety procedures. Seek help if you have been physically or sexually abused. What's next? Visit your health care provider once a year for an annual wellness visit. Ask your health care provider how often you should have your eyes and teeth checked. Stay up to date on all vaccines. This information is not intended to replace advice given to you by your health care provider. Make sure you discuss any questions you have with your health care provider. Document Revised: 10/05/2020 Document Reviewed: 10/05/2020 Elsevier Patient Education  [employer].

## 2021-06-15 NOTE — Progress Notes (Signed)
Subjective:    Patient ID: Caitlin Potter, female    DOB: 1971/04/23, 51 y.o.   MRN: [id number]  Patient presents today for CPE   HPI Annual skin exam by dermatology  Vision:up to date Dental:up to date Diet:heart healthy Exercise:walking  Weight:  Wt Readings from Last 3 Encounters:  06/15/21 141 lb 6.4 oz (64.1 kg)  01/23/19 128 lb 6.4 oz (58.2 kg)  10/16/18 125 lb (56.7 kg)    Sexual History (orientation,birth control, marital status, STD): pelvic and breast exam performed by GYN.  Depression/Suicide: Depression screen [facility] 2/9 06/15/2021 06/24/2018 12/24/2017  Decreased Interest 0 0 0  Down, Depressed, Hopeless 0 0 0  PHQ - 2 Score 0 0 0   Immunizations: (TDAP, Hep C screen, Pneumovax, Influenza, zoster)  Health Maintenance  Topic Date Due   Tetanus Vaccine  Never done   Colon Cancer Screening  Never done   Pap Smear  03/19/2021   Flu Shot  07/21/2021*   Hepatitis C Screening: USPSTF Recommendation to screen - Ages 18-79 yo.  06/15/2022*   HIV Screening  06/15/2022*   HPV Vaccine  Aged Out  *Topic was postponed. The date shown is not the original due date.   Fall Risk: Fall Risk  06/24/2018 12/24/2017  Falls in the past year? 0 No   Medications and allergies reviewed with patient and updated if appropriate.  Patient Active Problem List   Diagnosis Date Noted   Right elbow tendonitis 01/31/2021   Tonsillitis 10/09/2018   Intrauterine contraceptive device 06/24/2018   Gestational diabetes mellitus 06/24/2018   Abnormal finding on mammography 03/29/2018    Current Outpatient Medications on File Prior to Visit  Medication Sig Dispense Refill   levonorgestrel (MIRENA) 20 MCG/24HR IUD Mirena 20 mcg/24 hours (5 yrs) 52 mg intrauterine device  Take 1 device by intrauterine route.     Multiple Vitamin (MULTI-VITAMIN PO) Multi Vitamin     No current facility-administered medications on file prior to visit.   Past Medical History:  Diagnosis Date   Allergy    Chicken  pox    Past Surgical History:  Procedure Laterality Date   CESAREAN SECTION  2005,2007,2011   Social History   Socioeconomic History   Marital status: Married    Spouse name: Not on file   Number of children: 4   Years of education: Not on file   Highest education level: Not on file  Occupational History   Not on file  Tobacco Use   Smoking status: Never   Smokeless tobacco: Never  Vaping Use   Vaping Use: Never used  Substance and Sexual Activity   Alcohol use: Yes    Alcohol/week: 5.0 standard drinks    Types: 5 Glasses of wine per week    Comment: at night   Drug use: No   Sexual activity: Yes    Birth control/protection: I.U.D.  Other Topics Concern   Not on file  Social History Narrative   Not on file   Social Determinants of Health   Financial Resource Strain: Not on file  Food Insecurity: Not on file  Transportation Needs: Not on file  Physical Activity: Not on file  Stress: Not on file  Social Connections: Not on file   Family History  Problem Relation Age of Onset   Arthritis Mother    Hyperlipidemia Mother    Hypertension Mother    Miscarriages / [place] Mother    Cancer Father        lung  cancer   Hearing loss Father    Hyperlipidemia Father    Hypertension Father    Kidney disease Father    Stroke Father    Hyperlipidemia Brother    Hypertension Brother    Asthma Daughter    Asthma Son    Arthritis Maternal Grandmother    Stroke Maternal Grandmother    Heart attack Maternal Grandfather    Diabetes Paternal Grandmother    Stroke Paternal Grandfather        Review of Systems  Constitutional:  Negative for fever, malaise/fatigue and weight loss.  HENT:  Negative for congestion and sore throat.   Eyes:        Negative for visual changes  Respiratory:  Negative for cough and shortness of breath.   Cardiovascular:  Negative for chest pain, palpitations and leg swelling.  Gastrointestinal:  Negative for blood in stool, constipation,  diarrhea and heartburn.  Genitourinary:  Negative for dysuria, frequency and urgency.  Musculoskeletal:  Negative for falls, joint pain and myalgias.  Skin:  Negative for rash.  Neurological:  Negative for dizziness, sensory change and headaches.  Endo/Heme/Allergies:  Does not bruise/bleed easily.  Psychiatric/Behavioral:  Negative for depression, substance abuse and suicidal ideas. The patient is not nervous/anxious.    Objective:   Vitals:   06/15/21 0825  BP: 118/76  Pulse: 78  Temp: 97.7 F (36.5 C)  SpO2: 99%   Body mass index is 21.19 kg/m.  Physical Examination:  Physical Exam Vitals reviewed.  Constitutional:      General: She is not in acute distress.    Appearance: She is well-developed.  HENT:     Right Ear: Tympanic membrane, ear canal and external ear normal.     Left Ear: Tympanic membrane, ear canal and external ear normal.  Eyes:     Extraocular Movements: Extraocular movements intact.     Conjunctiva/sclera: Conjunctivae normal.  Cardiovascular:     Rate and Rhythm: Normal rate and regular rhythm.     Pulses: Normal pulses.     Heart sounds: Normal heart sounds.  Pulmonary:     Effort: Pulmonary effort is normal. No respiratory distress.     Breath sounds: Normal breath sounds.  Chest:     Chest wall: No tenderness.  Abdominal:     General: Bowel sounds are normal.     Palpations: Abdomen is soft.  Genitourinary:    Comments: Deferred breast and pelvic exam to GYN Musculoskeletal:        General: Normal range of motion.     Cervical back: Normal range of motion and neck supple.     Right lower leg: No edema.     Left lower leg: No edema.  Skin:    General: Skin is warm and dry.  Neurological:     Mental Status: She is alert and oriented to person, place, and time.     Deep Tendon Reflexes: Reflexes are normal and symmetric.  Psychiatric:        Mood and Affect: Mood normal.        Behavior: Behavior normal.        Thought Content: Thought  content normal.   ASSESSMENT and PLAN: This visit occurred during the SARS-CoV-2 public health emergency.  Safety protocols were in place, including screening questions prior to the visit, additional usage of staff PPE, and extensive cleaning of exam room while observing appropriate contact time as indicated for disinfecting solutions.   Margurette was seen today for annual exam.  Diagnoses and all orders for this visit:  Preventative health care -     Ambulatory referral to Gastroenterology -     Comprehensive metabolic panel -     Cancel: CBC with Differential/Platelet -     Lipid panel -     TSH -     CBC  Need for diphtheria-tetanus-pertussis (Tdap) vaccine -     Tdap vaccine greater than or equal to 7yo IM  Encounter for lipid screening for cardiovascular disease -     Lipid panel  Colon cancer screening -     Ambulatory referral to Gastroenterology      Problem List Items Addressed This Visit   None Visit Diagnoses     Preventative health care    -  Primary   Relevant Orders   Ambulatory referral to Gastroenterology   Comprehensive metabolic panel   Lipid panel   TSH   CBC   Need for diphtheria-tetanus-pertussis (Tdap) vaccine       Relevant Orders   Tdap vaccine greater than or equal to 7yo IM   Encounter for lipid screening for cardiovascular disease       Relevant Orders   Lipid panel   Colon cancer screening       Relevant Orders   Ambulatory referral to Gastroenterology       Follow up: Return in about 1 year (around 06/15/2022) for CPE (fasting).  Alysia Penna, NP

## 2022-01-12 DIAGNOSIS — D2271 Melanocytic nevi of right lower limb, including hip: Secondary | ICD-10-CM | POA: Diagnosis not present

## 2022-01-12 DIAGNOSIS — L565 Disseminated superficial actinic porokeratosis (DSAP): Secondary | ICD-10-CM | POA: Diagnosis not present

## 2022-01-12 DIAGNOSIS — D2272 Melanocytic nevi of left lower limb, including hip: Secondary | ICD-10-CM | POA: Diagnosis not present

## 2022-01-12 DIAGNOSIS — L814 Other melanin hyperpigmentation: Secondary | ICD-10-CM | POA: Diagnosis not present

## 2022-01-12 DIAGNOSIS — L570 Actinic keratosis: Secondary | ICD-10-CM | POA: Diagnosis not present

## 2022-01-19 ENCOUNTER — Other Ambulatory Visit: Payer: Self-pay | Admitting: Obstetrics and Gynecology

## 2022-01-19 DIAGNOSIS — Z1231 Encounter for screening mammogram for malignant neoplasm of breast: Secondary | ICD-10-CM

## 2022-02-23 ENCOUNTER — Ambulatory Visit: Payer: BC Managed Care – PPO

## 2022-02-27 ENCOUNTER — Ambulatory Visit
Admission: RE | Admit: 2022-02-27 | Discharge: 2022-02-27 | Disposition: A | Discharge status: Home / Self Care | Payer: BC Managed Care – PPO | Source: Ambulatory Visit | Attending: Obstetrics and Gynecology | Admitting: Obstetrics and Gynecology

## 2022-02-27 DIAGNOSIS — Z1231 Encounter for screening mammogram for malignant neoplasm of breast: Secondary | ICD-10-CM | POA: Diagnosis not present

## 2022-08-06 ENCOUNTER — Other Ambulatory Visit: Payer: Self-pay

## 2022-08-06 ENCOUNTER — Encounter: Payer: Self-pay | Admitting: Nurse Practitioner

## 2022-08-06 DIAGNOSIS — Z1211 Encounter for screening for malignant neoplasm of colon: Secondary | ICD-10-CM

## 2022-08-07 DIAGNOSIS — Z789 Other specified health status: Secondary | ICD-10-CM | POA: Insufficient documentation

## 2022-08-08 DIAGNOSIS — D0421 Carcinoma in situ of skin of right ear and external auricular canal: Secondary | ICD-10-CM | POA: Diagnosis not present

## 2022-08-09 DIAGNOSIS — Z780 Asymptomatic menopausal state: Secondary | ICD-10-CM | POA: Diagnosis not present

## 2022-08-09 DIAGNOSIS — Z01419 Encounter for gynecological examination (general) (routine) without abnormal findings: Secondary | ICD-10-CM | POA: Diagnosis not present

## 2022-08-09 DIAGNOSIS — Z124 Encounter for screening for malignant neoplasm of cervix: Secondary | ICD-10-CM | POA: Diagnosis not present

## 2022-08-09 DIAGNOSIS — N951 Menopausal and female climacteric states: Secondary | ICD-10-CM | POA: Diagnosis not present

## 2022-08-09 DIAGNOSIS — Z1151 Encounter for screening for human papillomavirus (HPV): Secondary | ICD-10-CM | POA: Diagnosis not present

## 2022-08-09 DIAGNOSIS — Z130 Encounter for screening for diseases of the blood and blood-forming organs and certain disorders involving the immune mechanism: Secondary | ICD-10-CM | POA: Diagnosis not present

## 2022-08-15 ENCOUNTER — Ambulatory Visit (AMBULATORY_SURGERY_CENTER): Payer: BC Managed Care – PPO

## 2022-08-15 ENCOUNTER — Encounter: Payer: Self-pay | Admitting: Internal Medicine

## 2022-08-15 VITALS — Ht 68.5 in | Wt 140.0 lb

## 2022-08-15 DIAGNOSIS — Z1211 Encounter for screening for malignant neoplasm of colon: Secondary | ICD-10-CM

## 2022-08-15 NOTE — Progress Notes (Signed)

## 2022-08-16 LAB — RESULTS CONSOLE HPV: CHL HPV: NEGATIVE

## 2022-08-16 LAB — HM PAP SMEAR

## 2022-08-29 ENCOUNTER — Encounter: Payer: Self-pay | Admitting: Internal Medicine

## 2022-08-29 ENCOUNTER — Ambulatory Visit (AMBULATORY_SURGERY_CENTER): Payer: BC Managed Care – PPO | Admitting: Internal Medicine

## 2022-08-29 VITALS — BP 132/98 | HR 64 | Temp 98.6°F | Resp 13 | Ht 68.5 in | Wt 140.0 lb

## 2022-08-29 DIAGNOSIS — Z1211 Encounter for screening for malignant neoplasm of colon: Secondary | ICD-10-CM | POA: Diagnosis not present

## 2022-08-29 MED ORDER — SODIUM CHLORIDE 0.9 % IV SOLN: 500.0000 mL | Freq: Once | INTRAVENOUS | Status: AC

## 2022-08-29 NOTE — Op Note (Signed)
[facility] Patient Name: Caitlin Potter Procedure Date: 08/29/2022 8:21 AM MRN: [id number] Endoscopist: Iva Boop , MD, [id number] Age: 52 Referring MD:  Date of Birth: 05/05/1970 Gender: Female Account #: [id number] Procedure:                Colonoscopy Indications:              Screening for colorectal malignant neoplasm, This                            is the patient's first colonoscopy Medicines:                Monitored Anesthesia Care Procedure:                Pre-Anesthesia Assessment:                           - Prior to the procedure, a History and Physical                            was performed, and patient medications and                            allergies were reviewed. The patient's tolerance of                            previous anesthesia was also reviewed. The risks                            and benefits of the procedure and the sedation                            options and risks were discussed with the patient.                            All questions were answered, and informed consent                            was obtained. Prior Anticoagulants: The patient has                            taken no anticoagulant or antiplatelet agents. ASA                            Grade Assessment: I - A normal, healthy patient.                            After reviewing the risks and benefits, the patient                            was deemed in satisfactory condition to undergo the                            procedure.  After obtaining informed consent, the colonoscope                            was passed under direct vision. Throughout the                            procedure, the patient's blood pressure, pulse, and                            oxygen saturations were monitored continuously. The                            CF [id number] #[id number] was introduced through the anus                            and advanced to the the cecum,  identified by                            appendiceal orifice and ileocecal valve. The                            colonoscopy was somewhat difficult due to                            significant looping. Successful completion of the                            procedure was aided by straightening and shortening                            the scope to obtain bowel loop reduction, using                            scope torsion and applying abdominal pressure. The                            patient tolerated the procedure well. The quality                            of the bowel preparation was excellent. The                            ileocecal valve, appendiceal orifice, and rectum                            were photographed. The bowel preparation used was                            Miralax via split dose instruction. Scope In: 8:44:59 AM Scope Out: 9:06:48 AM Scope Withdrawal Time: 0 hours 10 minutes 49 seconds  Total Procedure Duration: 0 hours 21 minutes 49 seconds  Findings:                 The perianal and digital rectal examinations were  normal.                           The entire examined colon appeared normal on direct                            and retroflexion views. Complications:            No immediate complications. Estimated Blood Loss:     Estimated blood loss: none. Impression:               - The entire examined colon is normal on direct and                            retroflexion views.                           - No specimens collected. Recommendation:           - Patient has a contact number available for                            emergencies. The signs and symptoms of potential                            delayed complications were discussed with the                            patient. Return to normal activities tomorrow.                            Written discharge instructions were provided to the                            patient.                            - Resume previous diet.                           - Continue present medications.                           - Repeat colonoscopy in 10 years for screening                            purposes. Iva Boop, MD 08/29/2022 9:10:49 AM This report has been signed electronically.

## 2022-08-29 NOTE — Progress Notes (Signed)
Report given to PACU, vss 

## 2022-08-29 NOTE — Progress Notes (Signed)
Pt's states no medical or surgical changes since previsit or office visit. 

## 2022-08-29 NOTE — Progress Notes (Signed)
[facility] History and Physical   Primary Care Physician:  Nche, Bonna Gains, NP   Reason for Procedure:   CRCA screening  Plan:    colonoscopy     HPI: Caitlin Potter is a 52 y.o. female here for above   Past Medical History:  Diagnosis Date   Allergy    Chicken pox     Past Surgical History:  Procedure Laterality Date   CESAREAN SECTION  2005,2007,2011    Prior to Admission medications   Medication Sig Start Date End Date Taking? Authorizing Provider  levonorgestrel (MIRENA) 20 MCG/24HR IUD Mirena 20 mcg/24 hours (5 yrs) 52 mg intrauterine device  Take 1 device by intrauterine route.   Yes [provider]  Multiple Vitamin (MULTI-VITAMIN PO) Multi Vitamin   Yes [provider]  estradiol (VIVELLE-DOT) 0.05 MG/24HR patch Place 1 patch onto the skin 2 (two) times a week. 08/09/22   [provider]    Current Outpatient Medications  Medication Sig Dispense Refill   levonorgestrel (MIRENA) 20 MCG/24HR IUD Mirena 20 mcg/24 hours (5 yrs) 52 mg intrauterine device  Take 1 device by intrauterine route.     Multiple Vitamin (MULTI-VITAMIN PO) Multi Vitamin     estradiol (VIVELLE-DOT) 0.05 MG/24HR patch Place 1 patch onto the skin 2 (two) times a week.     Current Facility-Administered Medications  Medication Dose Route Frequency Provider Last Rate Last Admin   0.9 %  sodium chloride infusion  500 mL Intravenous Once Iva Boop, MD        Allergies as of 08/29/2022 - Review Complete 08/29/2022  Allergen Reaction Noted   Codeine Diarrhea 12/24/2017   Misc. sulfonamide containing compounds  08/07/2022   Sulfa antibiotics Diarrhea 12/24/2017   Vicodin [hydrocodone-acetaminophen]  12/21/2013    Family History  Problem Relation Age of Onset   Arthritis Mother    Hyperlipidemia Mother    Hypertension Mother    Miscarriages / [place] Mother    Cancer Father        lung cancer   Hearing loss Father    Hyperlipidemia  Father    Hypertension Father    Kidney disease Father    Stroke Father    Hyperlipidemia Brother    Hypertension Brother    Arthritis Maternal Grandmother    Stroke Maternal Grandmother    Heart attack Maternal Grandfather    Diabetes Paternal Grandmother    Stroke Paternal Grandfather    Asthma Daughter    Asthma Son    Colon cancer Neg Hx    Colon polyps Neg Hx    Esophageal cancer Neg Hx    Rectal cancer Neg Hx    Stomach cancer Neg Hx     Social History   Socioeconomic History   Marital status: Married    Spouse name: Not on file   Number of children: 4   Years of education: Not on file   Highest education level: Not on file  Occupational History   Not on file  Tobacco Use   Smoking status: Never   Smokeless tobacco: Never  Vaping Use   Vaping Use: Never used  Substance and Sexual Activity   Alcohol use: Yes    Alcohol/week: 5.0 standard drinks of alcohol    Types: 5 Glasses of wine per week    Comment: at night   Drug use: No   Sexual activity: Yes    Birth control/protection: I.U.D.  Other Topics Concern   Not on file  Social History Narrative   Not on file   Social Determinants of Health   Financial Resource Strain: Not on file  Food Insecurity: Not on file  Transportation Needs: Not on file  Physical Activity: Sufficiently Active (12/24/2017)   Exercise Vital Sign    Days of Exercise per Week: 5 days    Minutes of Exercise per Session: 60 min  Stress: Not on file  Social Connections: Not on file  Intimate Partner Violence: Not on file    Review of Systems:  All other review of systems negative except as mentioned in the HPI.  Physical Exam: Vital signs BP (!) 144/99   Pulse 75   Temp 98.6 F (37 C)   Resp 12   Ht 5' 8.5" (1.74 m)   Wt 140 lb (63.5 kg)   SpO2 100%   BMI 20.98 kg/m   General:   Alert,  Well-developed, well-nourished, pleasant and cooperative in NAD Lungs:  Clear throughout to auscultation.   Heart:  Regular rate  and rhythm; no murmurs, clicks, rubs,  or gallops. Abdomen:  Soft, nontender and nondistended. Normal bowel sounds.   Neuro/Psych:  Alert and cooperative. Normal mood and affect. A and O x 3   @Tahisha Hakim  Sena Slate, MD, [facility] [phone] (pager) 08/29/2022 8:33 AM@

## 2022-08-29 NOTE — Patient Instructions (Addendum)
Colonoscopy was normal - no polyps or cancer seen.  Next routine colonoscopy or other screening test in 10 years - 2034.  I appreciate the opportunity to care for you. Iva Boop, MD, [facility]  Resume all of your previous medications as ordered.   YOU HAD AN ENDOSCOPIC PROCEDURE TODAY AT THE [facility]:   Refer to the procedure report that was given to you for any specific questions about what was found during the examination.  If the procedure report does not answer your questions, please call your gastroenterologist to clarify.  If you requested that your care partner not be given the details of your procedure findings, then the procedure report has been included in a sealed envelope for you to review at your convenience later.  YOU SHOULD EXPECT: Some feelings of bloating in the abdomen. Passage of more gas than usual.  Walking can help get rid of the air that was put into your GI tract during the procedure and reduce the bloating. If you had a lower endoscopy (such as a colonoscopy or flexible sigmoidoscopy) you may notice spotting of blood in your stool or on the toilet paper. If you underwent a bowel prep for your procedure, you may not have a normal bowel movement for a few days.  Please Note:  You might notice some irritation and congestion in your nose or some drainage.  This is from the oxygen used during your procedure.  There is no need for concern and it should clear up in a day or so.  SYMPTOMS TO REPORT IMMEDIATELY:  Following lower endoscopy (colonoscopy or flexible sigmoidoscopy):  Excessive amounts of blood in the stool  Significant tenderness or worsening of abdominal pains  Swelling of the abdomen that is new, acute  Fever of 100F or higher    For urgent or emergent issues, a gastroenterologist can be reached at any hour by calling [phone]. Do not use MyChart messaging for urgent concerns.    DIET:  We do recommend a small meal at first, but  then you may proceed to your regular diet.  Drink plenty of fluids but you should avoid alcoholic beverages for 24 hours.  ACTIVITY:  You should plan to take it easy for the rest of today and you should NOT DRIVE or use heavy machinery until tomorrow (because of the sedation medicines used during the test).    FOLLOW UP: Our staff will call the number listed on your records the next business day following your procedure.  We will call around 7:15- 8:00 am to check on you and address any questions or concerns that you may have regarding the information given to you following your procedure. If we do not reach you, we will leave a message.      SIGNATURES/CONFIDENTIALITY: You and/or your care partner have signed paperwork which will be entered into your electronic medical record.  These signatures attest to the fact that that the information above on your After Visit Summary has been reviewed and is understood.  Full responsibility of the confidentiality of this discharge information lies with you and/or your care-partner.

## 2022-08-30 ENCOUNTER — Telehealth: Payer: Self-pay | Admitting: *Deleted

## 2022-08-30 NOTE — Telephone Encounter (Signed)
No answer on  follow up call. Left message.   

## 2022-09-06 DIAGNOSIS — Z135 Encounter for screening for eye and ear disorders: Secondary | ICD-10-CM | POA: Diagnosis not present

## 2022-09-06 DIAGNOSIS — H5203 Hypermetropia, bilateral: Secondary | ICD-10-CM | POA: Diagnosis not present

## 2022-09-06 DIAGNOSIS — H52223 Regular astigmatism, bilateral: Secondary | ICD-10-CM | POA: Diagnosis not present

## 2022-09-06 DIAGNOSIS — H524 Presbyopia: Secondary | ICD-10-CM | POA: Diagnosis not present

## 2022-10-31 IMAGING — MG MM DIGITAL SCREENING BILAT W/ TOMO AND CAD
6 of 10 series · 6 of 30 positions shown · non-contrast
Comparison: Previous exam(s).

CLINICAL DATA: Screening.

EXAM:
DIGITAL SCREENING BILATERAL MAMMOGRAM WITH TOMOSYNTHESIS AND CAD
TECHNIQUE: Bilateral screening digital craniocaudal and mediolateral oblique
mammograms were obtained. Bilateral screening digital breast
tomosynthesis was performed. The images were evaluated with
computer-aided detection.

[R CC synth-2D (1 of 2)]
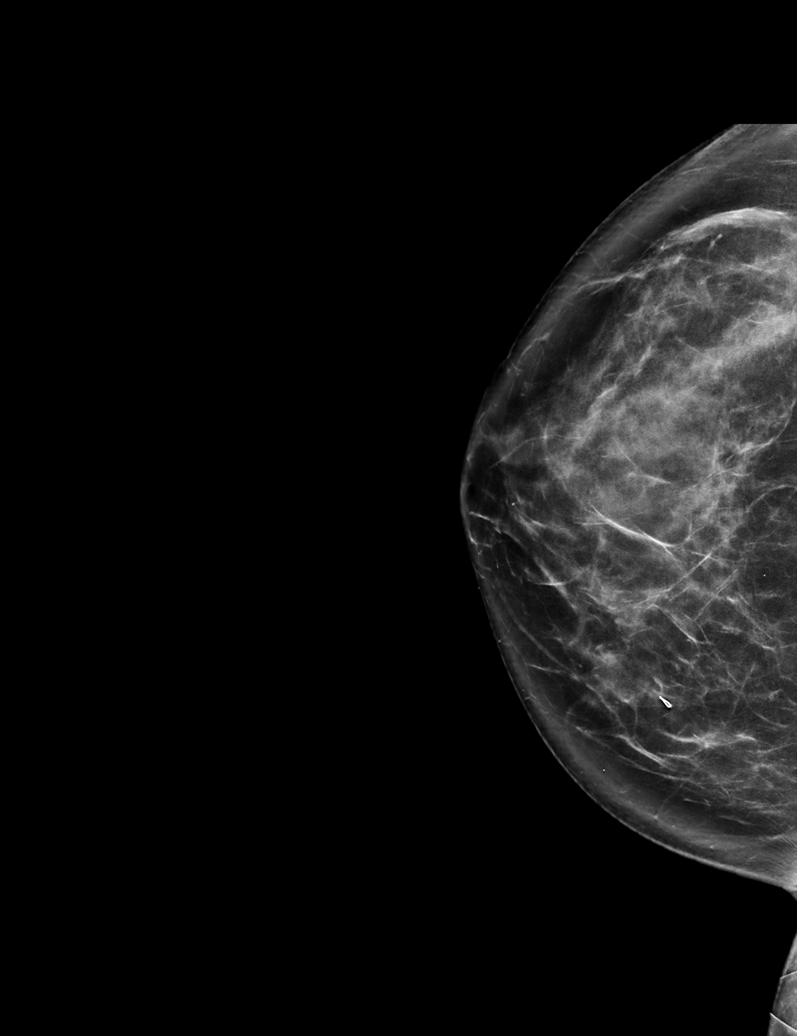

[R CC synth-2D (2 of 2)]
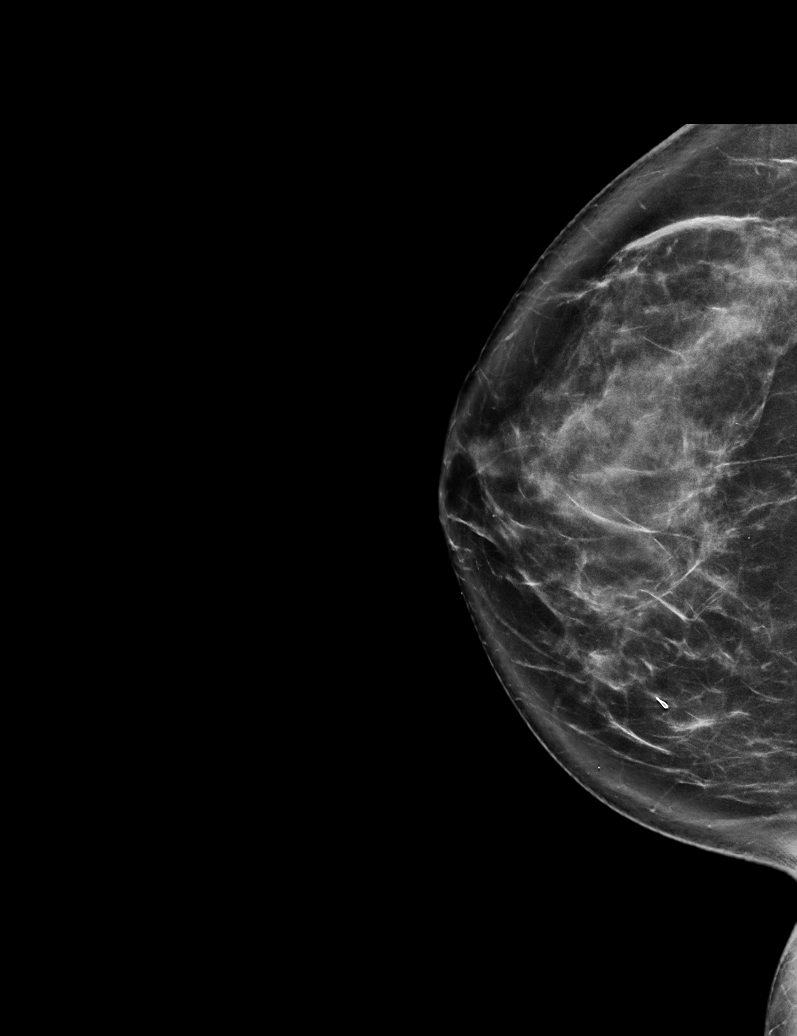

[R MLO synth-2D]
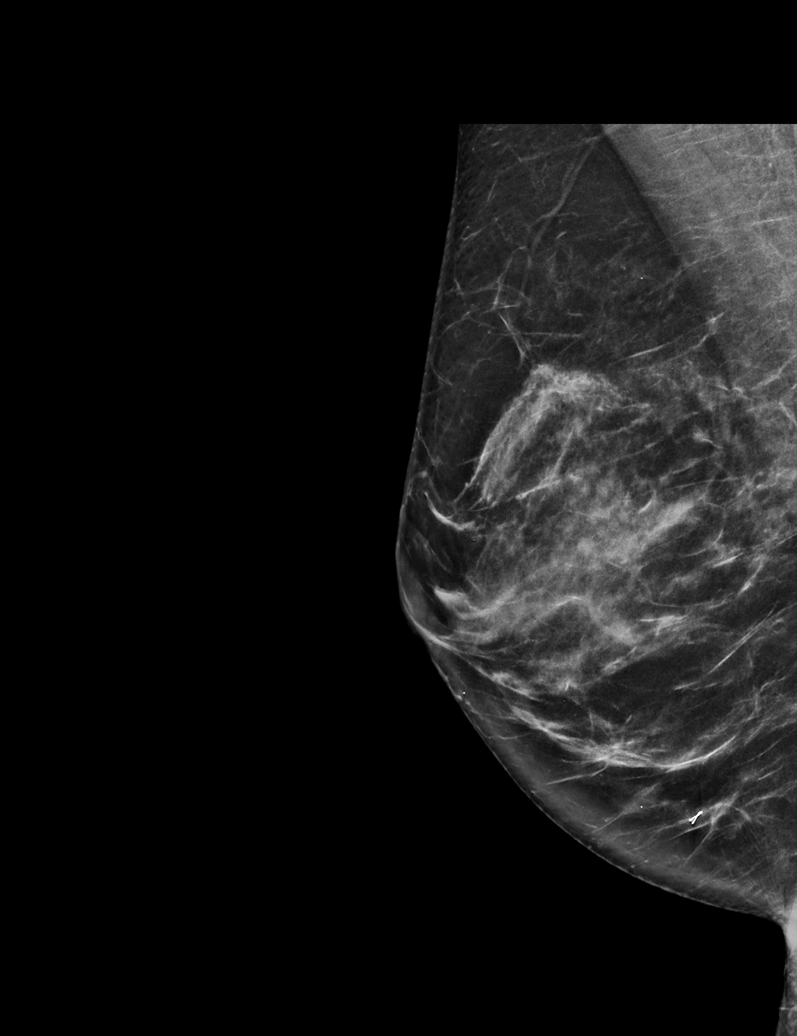

[L MLO synth-2D]
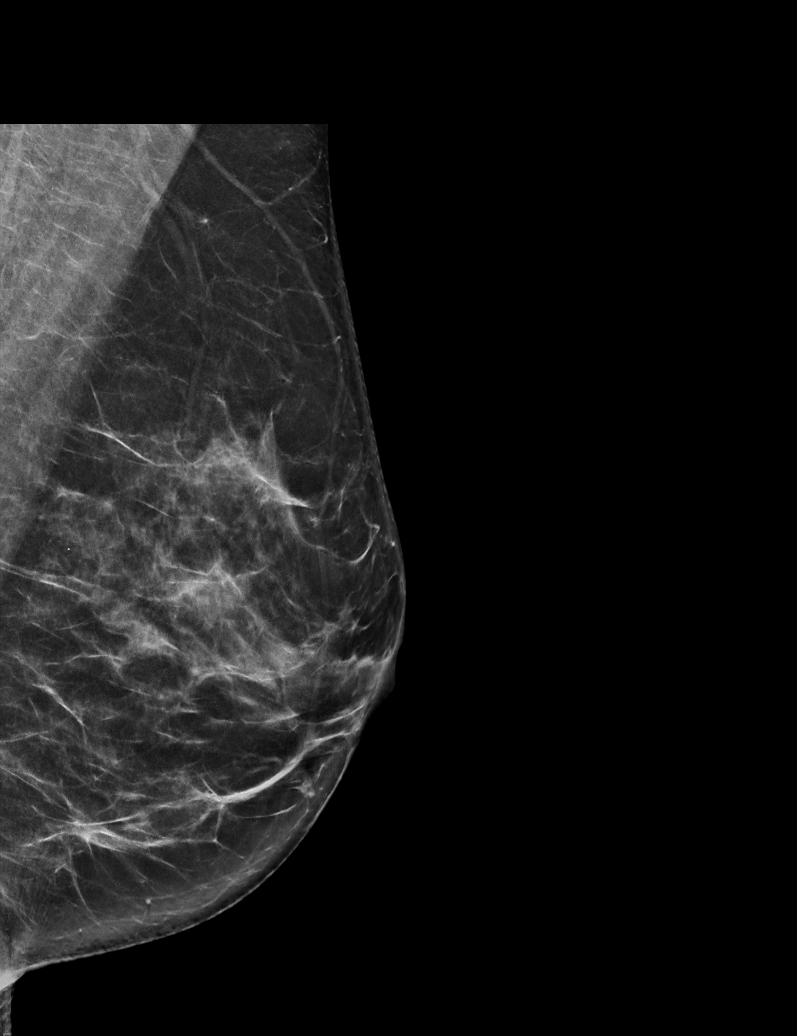

[L CC synth-2D]
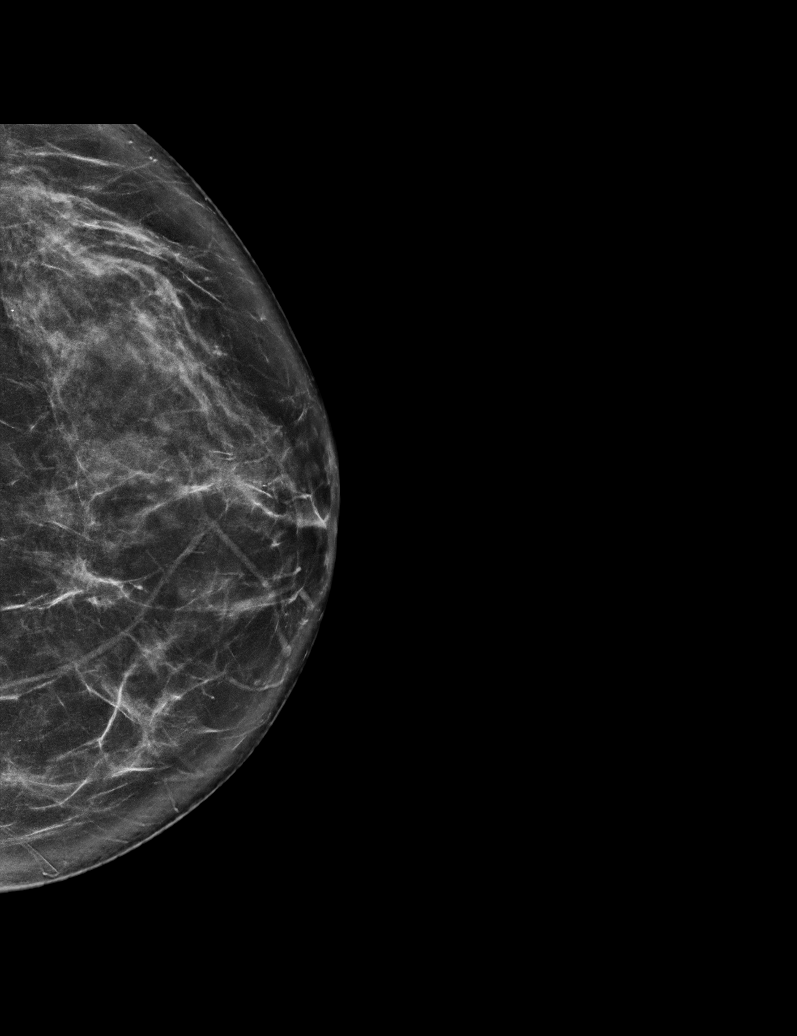

[R MLO tomo · tomo slice 35/69.0]
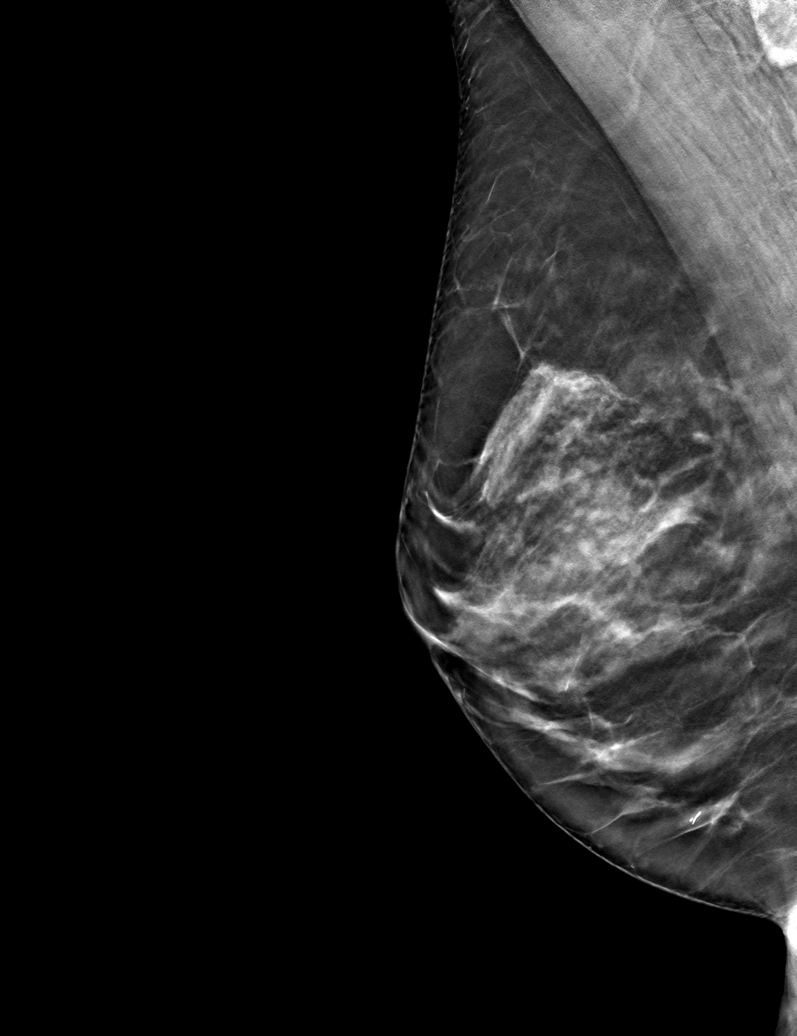

[6 of 30 positions shown; findings below may reference images not displayed]

ACR Breast Density Category c: The breast tissue is heterogeneously
dense, which may obscure small masses.
FINDINGS: There are no findings suspicious for malignancy.
IMPRESSION: No mammographic evidence of malignancy. A result letter of this
screening mammogram will be mailed directly to the patient.

RECOMMENDATION:
Screening mammogram in one year. (Code:Q3-W-BC3)

BI-RADS CATEGORY  1: Negative.

## 2022-11-06 DIAGNOSIS — Z30430 Encounter for insertion of intrauterine contraceptive device: Secondary | ICD-10-CM | POA: Diagnosis not present

## 2022-11-06 DIAGNOSIS — Z30433 Encounter for removal and reinsertion of intrauterine contraceptive device: Secondary | ICD-10-CM | POA: Diagnosis not present

## 2023-01-16 DIAGNOSIS — D225 Melanocytic nevi of trunk: Secondary | ICD-10-CM | POA: Diagnosis not present

## 2023-01-16 DIAGNOSIS — D2272 Melanocytic nevi of left lower limb, including hip: Secondary | ICD-10-CM | POA: Diagnosis not present

## 2023-01-16 DIAGNOSIS — L570 Actinic keratosis: Secondary | ICD-10-CM | POA: Diagnosis not present

## 2023-01-16 DIAGNOSIS — D485 Neoplasm of uncertain behavior of skin: Secondary | ICD-10-CM | POA: Diagnosis not present

## 2023-01-16 DIAGNOSIS — L814 Other melanin hyperpigmentation: Secondary | ICD-10-CM | POA: Diagnosis not present

## 2023-01-16 DIAGNOSIS — H61001 Unspecified perichondritis of right external ear: Secondary | ICD-10-CM | POA: Diagnosis not present

## 2023-01-16 DIAGNOSIS — D2271 Melanocytic nevi of right lower limb, including hip: Secondary | ICD-10-CM | POA: Diagnosis not present

## 2023-03-06 DIAGNOSIS — L988 Other specified disorders of the skin and subcutaneous tissue: Secondary | ICD-10-CM | POA: Diagnosis not present

## 2023-03-06 DIAGNOSIS — D485 Neoplasm of uncertain behavior of skin: Secondary | ICD-10-CM | POA: Diagnosis not present

## 2023-04-30 ENCOUNTER — Other Ambulatory Visit: Payer: Self-pay | Admitting: Obstetrics and Gynecology

## 2023-04-30 DIAGNOSIS — Z1231 Encounter for screening mammogram for malignant neoplasm of breast: Secondary | ICD-10-CM

## 2023-05-06 ENCOUNTER — Ambulatory Visit (INDEPENDENT_AMBULATORY_CARE_PROVIDER_SITE_OTHER): Payer: BC Managed Care – PPO | Admitting: Nurse Practitioner

## 2023-05-06 VITALS — BP 137/82 | HR 75 | Temp 98.1°F | Resp 18 | Ht 68.0 in | Wt 143.8 lb

## 2023-05-06 DIAGNOSIS — Z23 Encounter for immunization: Secondary | ICD-10-CM

## 2023-05-06 DIAGNOSIS — Z0000 Encounter for general adult medical examination without abnormal findings: Secondary | ICD-10-CM | POA: Diagnosis not present

## 2023-05-06 DIAGNOSIS — E7800 Pure hypercholesterolemia, unspecified: Secondary | ICD-10-CM

## 2023-05-06 HISTORY — DX: Pure hypercholesterolemia, unspecified: E78.00

## 2023-05-06 NOTE — Patient Instructions (Signed)
 Schedule nurse visit for 2nd shringrix vaccine in 2months Schedule fasting lab appt. Need to be fasting 8hrs prior to blood draw.  Maintain Heart healthy diet and daily exercise.  Preventive Care 52-52 Years Old, Female Preventive care refers to lifestyle choices and visits with your health care provider that can promote health and wellness. Preventive care visits are also called wellness exams. What can I expect for my preventive care visit? Counseling Your health care provider may ask you questions about your: Medical history, including: Past medical problems. Family medical history. Pregnancy history. Current health, including: Menstrual cycle. Method of birth control. Emotional well-being. Home life and relationship well-being. Sexual activity and sexual health. Lifestyle, including: Alcohol, nicotine or tobacco, and drug use. Access to firearms. Diet, exercise, and sleep habits. Work and work astronomer. Sunscreen use. Safety issues such as seatbelt and bike helmet use. Physical exam Your health care provider will check your: Height and weight. These may be used to calculate your BMI (body mass index). BMI is a measurement that tells if you are at a healthy weight. Waist circumference. This measures the distance around your waistline. This measurement also tells if you are at a healthy weight and may help predict your risk of certain diseases, such as type 2 diabetes and high blood pressure. Heart rate and blood pressure. Body temperature. Skin for abnormal spots. What immunizations do I need?  Vaccines are usually given at various ages, according to a schedule. Your health care provider will recommend vaccines for you based on your age, medical history, and lifestyle or other factors, such as travel or where you work. What tests do I need? Screening Your health care provider may recommend screening tests for certain conditions. This may include: Lipid and cholesterol  levels. Diabetes screening. This is done by checking your blood sugar (glucose) after you have not eaten for a while (fasting). Pelvic exam and Pap test. Hepatitis B test. Hepatitis C test. HIV (human immunodeficiency virus) test. STI (sexually transmitted infection) testing, if you are at risk. Lung cancer screening. Colorectal cancer screening. Mammogram. Talk with your health care provider about when you should start having regular mammograms. This may depend on whether you have a family history of breast cancer. BRCA-related cancer screening. This may be done if you have a family history of breast, ovarian, tubal, or peritoneal cancers. Bone density scan. This is done to screen for osteoporosis. Talk with your health care provider about your test results, treatment options, and if necessary, the need for more tests. Follow these instructions at home: Eating and drinking  Eat a diet that includes fresh fruits and vegetables, whole grains, lean protein, and low-fat dairy products. Take vitamin and mineral supplements as recommended by your health care provider. Do not drink alcohol if: Your health care provider tells you not to drink. You are pregnant, may be pregnant, or are planning to become pregnant. If you drink alcohol: Limit how much you have to 0-1 drink a day. Know how much alcohol is in your drink. In the [place] one drink equals one 12 oz bottle of beer (355 mL), one 5 oz glass of wine (148 mL), or one 1 oz glass of hard liquor (44 mL). Lifestyle Brush your teeth every morning and night with fluoride toothpaste. Floss one time each day. Exercise for at least 30 minutes 5 or more days each week. Do not use any products that contain nicotine or tobacco. These products include cigarettes, chewing tobacco, and vaping devices, such as e-cigarettes.  If you need help quitting, ask your health care provider. Do not use drugs. If you are sexually active, practice safe sex. Use a  condom or other form of protection to prevent STIs. If you do not wish to become pregnant, use a form of birth control. If you plan to become pregnant, see your health care provider for a prepregnancy visit. Take aspirin only as told by your health care provider. Make sure that you understand how much to take and what form to take. Work with your health care provider to find out whether it is safe and beneficial for you to take aspirin daily. Find healthy ways to manage stress, such as: Meditation, yoga, or listening to music. Journaling. Talking to a trusted person. Spending time with friends and family. Minimize exposure to UV radiation to reduce your risk of skin cancer. Safety Always wear your seat belt while driving or riding in a vehicle. Do not drive: If you have been drinking alcohol. Do not ride with someone who has been drinking. When you are tired or distracted. While texting. If you have been using any mind-altering substances or drugs. Wear a helmet and other protective equipment during sports activities. If you have firearms in your house, make sure you follow all gun safety procedures. Seek help if you have been physically or sexually abused. What's next? Visit your health care provider once a year for an annual wellness visit. Ask your health care provider how often you should have your eyes and teeth checked. Stay up to date on all vaccines. This information is not intended to replace advice given to you by your health care provider. Make sure you discuss any questions you have with your health care provider. Document Revised: 10/05/2020 Document Reviewed: 10/05/2020 Elsevier Patient Education  2024 [employer].

## 2023-05-06 NOTE — Progress Notes (Signed)
 Complete physical exam  Patient: Caitlin Potter   DOB: 04/06/1971   52 y.o. Female  MRN: [id number] Visit Date: 05/06/2023  Subjective:    Chief Complaint  Patient presents with   Annual Exam    Cervical screening report requested; shingles vaccine is due    Caitlin Potter is a 53 y.o. female who presents today for a complete physical exam. She reports consuming a low fat and low sodium diet.  Cardio exercise 3-4x/week  She generally feels well. She reports sleeping well. She does not have additional problems to discuss today.  Vision:Yes Dental:Yes STD Screen:No Requested recent PAP smear report from GYN Has upcoming appointment for annual mammogram  BP Readings from Last 3 Encounters:  05/06/23 137/82  08/29/22 (!) 132/98  06/15/21 118/76   Wt Readings from Last 3 Encounters:  05/06/23 143 lb 12.8 oz (65.2 kg)  08/29/22 140 lb (63.5 kg)  08/15/22 140 lb (63.5 kg)    Most recent fall risk assessment:    05/06/2023    1:01 PM  Fall Risk   Falls in the past year? 0  Number falls in past yr: 0  Injury with Fall? 0  Risk for fall due to : No Fall Risks  Follow up Falls evaluation completed   Depression screen:Yes - No Depression Most recent depression screenings:    05/06/2023    1:07 PM 06/15/2021    8:31 AM  PHQ 2/9 Scores  PHQ - 2 Score 0 0  PHQ- 9 Score 2    HPI  No problem-specific Assessment & Plan notes found for this encounter.   Past Medical History:  Diagnosis Date   Allergy    Chicken pox    Past Surgical History:  Procedure Laterality Date   CESAREAN SECTION  2005,2007,2011   Social History   Socioeconomic History   Marital status: Married    Spouse name: Not on file   Number of children: 4   Years of education: Not on file   Highest education level: Bachelor's degree (e.g., BA, AB, BS)  Occupational History   Not on file  Tobacco Use   Smoking status: Never   Smokeless tobacco: Never  Vaping Use   Vaping status: Never Used   Substance and Sexual Activity   Alcohol use: Yes    Alcohol/week: 5.0 standard drinks of alcohol    Types: 5 Glasses of wine per week    Comment: at night   Drug use: No   Sexual activity: Yes    Birth control/protection: I.U.D.  Other Topics Concern   Not on file  Social History Narrative   Not on file   Social Drivers of Health   Financial Resource Strain: Low Risk  (05/06/2023)   Overall Financial Resource Strain (CARDIA)    Difficulty of Paying Living Expenses: Not hard at all  Food Insecurity: No Food Insecurity (05/06/2023)   Hunger Vital Sign    Worried About Running Out of Food in the Last Year: Never true    Ran Out of Food in the Last Year: Never true  Transportation Needs: No Transportation Needs (05/06/2023)   PRAPARE - Administrator, Civil Service (Medical): No    Lack of Transportation (Non-Medical): No  Physical Activity: Sufficiently Active (05/06/2023)   Exercise Vital Sign    Days of Exercise per Week: 5 days    Minutes of Exercise per Session: 40 min  Stress: No Stress Concern Present (05/06/2023)   [employer] of  Occupational Health - Occupational Stress Questionnaire    Feeling of Stress : Only a little  Social Connections: Socially Integrated (05/06/2023)   Social Connection and Isolation Panel [NHANES]    Frequency of Communication with Friends and Family: More than three times a week    Frequency of Social Gatherings with Friends and Family: Three times a week    Attends Religious Services: More than 4 times per year    Active Member of Clubs or Organizations: Yes    Attends Engineer, Structural: More than 4 times per year    Marital Status: Married  Catering Manager Violence: Not on file   Family Status  Relation Name Status   Mother Tevis Alive   Father Signe Alive   Brother [place] Alive   MGM Dickey Deceased   MGF  Deceased   PGM [place] Deceased   PGF [place] Deceased   Daughter Faith Alive   Son Hunter Alive   Neg Hx   (Not Specified)  No partnership data on file   Family History  Problem Relation Age of Onset   Arthritis Mother    Hyperlipidemia Mother    Hypertension Mother    Miscarriages / Stillbirths Mother    Cancer Father        lung cancer   Hearing loss Father    Hyperlipidemia Father    Hypertension Father    Kidney disease Father    Stroke Father    Hyperlipidemia Brother    Hypertension Brother    Arthritis Maternal Grandmother    Stroke Maternal Grandmother    Heart attack Maternal Grandfather    Diabetes Paternal Grandmother    Stroke Paternal Grandfather    Asthma Daughter    Asthma Son    Colon cancer Neg Hx    Colon polyps Neg Hx    Esophageal cancer Neg Hx    Rectal cancer Neg Hx    Stomach cancer Neg Hx    Allergies  Allergen Reactions   Codeine Diarrhea    vomit   Misc. Sulfonamide Containing Compounds     Other Reaction(s): Not available   Sulfa Antibiotics Diarrhea   Vicodin [Hydrocodone-Acetaminophen]     vomiting    Patient Care Team: Youssouf Shipley, Roselie Rockford, NP as PCP - General (Internal Medicine) Danielle Rom, MD as Consulting Physician (Obstetrics and Gynecology)   Medications: Outpatient Medications Prior to Visit  Medication Sig   estradiol (VIVELLE-DOT) 0.05 MG/24HR patch Place 1 patch onto the skin 2 (two) times a week.   levonorgestrel  (MIRENA ) 20 MCG/24HR IUD Mirena  20 mcg/24 hours (5 yrs) 52 mg intrauterine device  Take 1 device by intrauterine route.   Multiple Vitamin (MULTI-VITAMIN PO) Multi Vitamin   mupirocin ointment (BACTROBAN) 2 % SMARTSIG:sparingly Topical Daily   Facility-Administered Medications Prior to Visit  Medication Dose Route Frequency Provider   0.9 %  sodium chloride  infusion  500 mL Intravenous Once Avram Lupita BRAVO, MD    Review of Systems  Constitutional:  Negative for activity change, appetite change and unexpected weight change.  Respiratory: Negative.    Cardiovascular: Negative.   Gastrointestinal:  Negative.   Endocrine: Negative for cold intolerance and heat intolerance.  Genitourinary: Negative.   Musculoskeletal: Negative.   Skin: Negative.   Neurological: Negative.   Hematological: Negative.   Psychiatric/Behavioral:  Negative for behavioral problems, decreased concentration, dysphoric mood, hallucinations, self-injury, sleep disturbance and suicidal ideas. The patient is not nervous/anxious.        Objective:  BP 137/82 (BP  Location: Right Arm, Patient Position: Sitting, Cuff Size: Normal) Comment: recheck  Pulse 75   Temp 98.1 F (36.7 C) (Temporal)   Resp 18   Ht 5' 8 (1.727 m)   Wt 143 lb 12.8 oz (65.2 kg)   LMP  (Exact Date)   SpO2 99%   BMI 21.86 kg/m     Physical Exam Vitals and nursing note reviewed.  Constitutional:      General: She is not in acute distress. HENT:     Right Ear: Tympanic membrane, ear canal and external ear normal.     Left Ear: Tympanic membrane, ear canal and external ear normal.     Nose: Nose normal.  Eyes:     Extraocular Movements: Extraocular movements intact.     Conjunctiva/sclera: Conjunctivae normal.     Pupils: Pupils are equal, round, and reactive to light.  Neck:     Thyroid : No thyroid  mass, thyromegaly or thyroid  tenderness.  Cardiovascular:     Rate and Rhythm: Normal rate and regular rhythm.     Pulses: Normal pulses.     Heart sounds: Normal heart sounds.  Pulmonary:     Effort: Pulmonary effort is normal.     Breath sounds: Normal breath sounds.  Abdominal:     General: Bowel sounds are normal.     Palpations: Abdomen is soft.  Musculoskeletal:        General: Normal range of motion.     Cervical back: Normal range of motion and neck supple.     Right lower leg: No edema.     Left lower leg: No edema.  Lymphadenopathy:     Cervical: No cervical adenopathy.  Skin:    General: Skin is warm and dry.  Neurological:     Mental Status: She is alert and oriented to person, place, and time.     Cranial  Nerves: No cranial nerve deficit.  Psychiatric:        Mood and Affect: Mood normal.        Behavior: Behavior normal.        Thought Content: Thought content normal.     No results found for any visits on 05/06/23.    Assessment & Plan:    Routine Health Maintenance and Physical Exam  Immunization History  Administered Date(s) Administered   Influenza,inj,Quad PF,6+ Mos 01/23/2019   Influenza-Unspecified 03/02/2023   Tdap 06/15/2021   Zoster Recombinant(Shingrix ) 05/06/2023    Health Maintenance  Topic Date Due   Cervical Cancer Screening (HPV/Pap Cotest)  03/20/2023   COVID-19 Vaccine (1 - 2024-25 season) 07/22/2023 (Originally 12/23/2022)   Hepatitis C Screening  05/05/2024 (Originally 12/26/1989)   HIV Screening  05/05/2024 (Originally 12/27/1986)   Zoster Vaccines- Shingrix  (2 of 2) 07/01/2023   MAMMOGRAM  02/28/2024   DTaP/Tdap/Td (2 - Td or Tdap) 06/16/2031   Colonoscopy  08/28/2032   INFLUENZA VACCINE  Completed   HPV VACCINES  Aged Out   Discussed health benefits of physical activity, and encouraged her to engage in regular exercise appropriate for her age and condition.  Problem List Items Addressed This Visit   None Visit Diagnoses       Preventative health care    -  Primary   Relevant Orders   Comprehensive metabolic panel     Hypercholesterolemia       Relevant Orders   Lipid panel     Immunization due       Relevant Orders   Zoster Recombinant (Shingrix  ) (Completed)  Return in about 1 year (around 05/05/2024) for CPE (fasting).     Roselie Mood, NP

## 2023-05-07 ENCOUNTER — Encounter: Payer: Self-pay | Admitting: Nurse Practitioner

## 2023-05-07 ENCOUNTER — Other Ambulatory Visit: Payer: BC Managed Care – PPO

## 2023-05-07 DIAGNOSIS — E7800 Pure hypercholesterolemia, unspecified: Secondary | ICD-10-CM

## 2023-05-07 DIAGNOSIS — Z0000 Encounter for general adult medical examination without abnormal findings: Secondary | ICD-10-CM | POA: Diagnosis not present

## 2023-05-07 LAB — COMPREHENSIVE METABOLIC PANEL
ALT: 12 U/L (ref 0–35)
AST: 11 U/L (ref 0–37)
Albumin: 4.7 g/dL (ref 3.5–5.2)
Alkaline Phosphatase: 41 U/L (ref 39–117)
BUN: 14 mg/dL (ref 6–23)
CO2: 28 meq/L (ref 19–32)
Calcium: 8.9 mg/dL (ref 8.4–10.5)
Chloride: 101 meq/L (ref 96–112)
Creatinine, Ser: 0.76 mg/dL (ref 0.40–1.20)
GFR: 90.77 mL/min (ref >60.00)
Glucose, Bld: 99 mg/dL (ref 70–99)
Potassium: 4 meq/L (ref 3.5–5.1)
Sodium: 136 meq/L (ref 135–145)
Total Bilirubin: 1 mg/dL (ref 0.2–1.2)
Total Protein: 6.7 g/dL (ref 6.0–8.3)

## 2023-05-07 LAB — LIPID PANEL
Cholesterol: 201 mg/dL — ABNORMAL HIGH (ref 0–200)
HDL: 79.3 mg/dL
LDL Cholesterol: 112 mg/dL — ABNORMAL HIGH (ref 0–99)
NonHDL: 121.21
Total CHOL/HDL Ratio: 3
Triglycerides: 48 mg/dL (ref 0.0–149.0)
VLDL: 9.6 mg/dL (ref 0.0–40.0)

## 2023-05-14 ENCOUNTER — Ambulatory Visit: Payer: Self-pay | Admitting: Nurse Practitioner

## 2023-05-14 NOTE — Telephone Encounter (Signed)
  Chief Complaint: rash Symptoms: rash, hurts when itched Frequency: couple days Pertinent Negatives: Patient denies fever,   Disposition: [] ED /[] Urgent Care (no appt availability in office) / [x] Appointment(In office/virtual)/ []  [facility]/ [] Home Care/ [] Refused Recommended Disposition /[] [facility]/ []  Follow-up with PCP  Additional Notes: Pt had shingles vaccine about a week ago. States had chicken pox when she was younger. Pt states that the rash is painless unless scratched and does not have drainage. Pt sched for tomorrow, advised on care instructions per Epic.   Copied from CRM [id number]. Topic: Clinical - Red Word Triage >> May 14, 2023  4:32 PM Samuel Jester B wrote: Red Word that prompted transfer to Nurse Triage: Pt stated that she has a rash on lower back that spread to the right side of her stomach. She is experienecing burning and itching. She stated that thr rash came up Sunday 05/12/23 Reason for Disposition  Localized rash present > 7 days  Answer Assessment - Initial Assessment Questions 1. APPEARANCE of RASH: "Describe the rash."      Individual little bumps, other places have raised areas,  2. LOCATION: "Where is the rash located?"      R back and side 3. NUMBER: "How many spots are there?"      3 4. SIZE: "How big are the spots?" (Inches, centimeters or compare to size of a coin)      Bigger than a quarter and nickel sized 5. ONSET: "When did the rash start?"      couple 6. ITCHING: "Does the rash itch?" If Yes, ask: "How bad is the itch?"  (Scale 0-10; or none, mild, moderate, severe)     Yes itch, mild 7. PAIN: "Does the rash hurt?" If Yes, ask: "How bad is the pain?"  (Scale 0-10; or none, mild, moderate, severe)    - NONE (0): no pain    - MILD (1-3): doesn't interfere with normal activities     - MODERATE (4-7): interferes with normal activities or awakens from sleep     - SEVERE (8-10): excruciating pain, unable to do any normal  activities     Does not hurt 8. OTHER SYMPTOMS: "Do you have any other symptoms?" (e.g., fever)     denies  Protocols used: Rash or Redness - Localized-A-AH

## 2023-05-15 ENCOUNTER — Ambulatory Visit: Payer: BC Managed Care – PPO | Admitting: Nurse Practitioner

## 2023-05-15 ENCOUNTER — Encounter: Payer: Self-pay | Admitting: Nurse Practitioner

## 2023-05-15 VITALS — BP 121/80 | HR 62 | Temp 98.4°F | Resp 18 | Wt 141.4 lb

## 2023-05-15 DIAGNOSIS — B029 Zoster without complications: Secondary | ICD-10-CM | POA: Diagnosis not present

## 2023-05-15 DIAGNOSIS — Z975 Presence of (intrauterine) contraceptive device: Secondary | ICD-10-CM | POA: Diagnosis not present

## 2023-05-15 MED ORDER — VALACYCLOVIR HCL 1 G PO TABS: 1000.0000 mg | ORAL_TABLET | Freq: Three times a day (TID) | ORAL | 0 refills | Status: AC

## 2023-05-15 NOTE — Assessment & Plan Note (Addendum)
Received 1st shingrix vaccine 05/06/2023 Red, itchy and burning rash on right side of truck- T10-11 dermatome, started on 05/12/2023. Denies need for gabapentin or lyrica at this time.  Start valtrex Advised to use tylenol or ibuprofen for mild pain, avoid hot showers and use OVER THE COUNTER zinc oxide or calamine lotion or cold compress to soothe itching. Delay second shingrix vaccine to 04/2024

## 2023-05-15 NOTE — Progress Notes (Signed)
   Acute Office Visit  Subjective:    Patient ID: Caitlin Potter, female    DOB: 08-20-1970, 53 y.o.   MRN: [id number]  Chief Complaint  Patient presents with   Acute Visit    PT C/O of rash on lower back that started Sunday and progressed towards abdominal with mild itchiness and burning sensation.    HPI Herpes zoster without complication Received 1st shingrix vaccine 05/06/2023 Red, itchy and burning rash on right side of truck- T10-11 dermatome, started on 05/12/2023. Denies need for gabapentin or lyrica at this time.  Start valtrex Advised to use tylenol or ibuprofen for mild pain, avoid hot showers and use OVER THE COUNTER zinc oxide or calamine lotion or cold compress to soothe itching. Delay second shingrix vaccine to 04/2024  Outpatient Medications Prior to Visit  Medication Sig   estradiol (VIVELLE-DOT) 0.05 MG/24HR patch Place 1 patch onto the skin 2 (two) times a week.   FLUCELVAX 0.5 ML injection    levonorgestrel (MIRENA) 20 MCG/24HR IUD Mirena 20 mcg/24 hours (5 yrs) 52 mg intrauterine device  Take 1 device by intrauterine route.   Multiple Vitamin (MULTI-VITAMIN PO) Multi Vitamin   mupirocin ointment (BACTROBAN) 2 % SMARTSIG:sparingly Topical Daily   Facility-Administered Medications Prior to Visit  Medication Dose Route Frequency Provider   0.9 %  sodium chloride infusion  500 mL Intravenous Once Iva Boop, MD    Reviewed past medical and social history.  Review of Systems Per HPI     Objective:    Physical Exam Vitals and nursing note reviewed.  Constitutional:      General: She is not in acute distress. Skin:    Findings: Erythema and rash present. Rash is macular.       Neurological:     Mental Status: She is oriented to person, place, and time.    BP 121/80 (BP Location: Right Arm, Patient Position: Sitting, Cuff Size: Normal)   Pulse 62   Temp 98.4 F (36.9 C) (Temporal)   Resp 18   Wt 141 lb 6.4 oz (64.1 kg)   SpO2 100%   BMI  21.50 kg/m    No results found for any visits on 05/15/23.     Assessment & Plan:   Problem List Items Addressed This Visit     Herpes zoster without complication - Primary   Received 1st shingrix vaccine 05/06/2023 Red, itchy and burning rash on right side of truck- T10-11 dermatome, started on 05/12/2023. Denies need for gabapentin or lyrica at this time.  Start valtrex Advised to use tylenol or ibuprofen for mild pain, avoid hot showers and use OVER THE COUNTER zinc oxide or calamine lotion or cold compress to soothe itching. Delay second shingrix vaccine to 04/2024      Relevant Medications   FLUCELVAX 0.5 ML injection   valACYclovir (VALTREX) 1000 MG tablet   Intrauterine contraceptive device     Meds ordered this encounter  Medications   valACYclovir (VALTREX) 1000 MG tablet    Sig: Take 1 tablet (1,000 mg total) by mouth 3 (three) times daily for 7 days.    Dispense:  21 tablet    Refill:  0    Supervising Provider:   Mliss Sax [[id number]]   Return if symptoms worsen or fail to improve.    Alysia Penna, NP

## 2023-05-15 NOTE — Patient Instructions (Addendum)
Do not take second zoster vaccine for the next 1year.  Shingles  Shingles, or herpes zoster, is an infection. It gives you a skin rash and blisters. These infected areas may hurt a lot. Shingles only happens if: You've had chickenpox. You've been given a shot called a vaccine to protect you from getting chickenpox. Shingles is rare in this case. What are the causes? Shingles is caused by a germ called the varicella-zoster virus. This is the same germ that causes chickenpox. After you're exposed to the germ, it stays in your body but is dormant. This means it isn't active. Shingles happens if the germ becomes active again. This can happen years after you're first exposed to the germ. What increases the risk? You may be more likely to get shingles if: You're older than 52 years of age. You're under a lot of stress. You have a weak immune system. The immune system is your body's defense system. It may be weak if: You have human immunodeficiency virus (HIV). You have acquired immunodeficiency syndrome (AIDS). You have cancer. You take medicines that weaken your immune system. These include organ transplant medicines. What are the signs or symptoms? The first symptoms of shingles may be itching, tingling, or pain. Your skin may feel like it's burning. A few days or weeks later, you'll get a rash. Here's what you can expect: The rash is likely to be on one side of your body. The rash may be shaped like a belt or a band. Over time, it will turn into blisters filled with fluid. The blisters will break open and change into scabs. The scabs will dry up in about 2-3 weeks. You may also have: A fever. Chills. A headache. Nausea. How is this diagnosed? Shingles is diagnosed with a skin exam. A sample called a culture may be taken from one of your blisters and sent to a lab. This will show if you have shingles. How is this treated? The rash may last for several weeks. There's no cure for  shingles, but your health care provider may give you medicines. These medicines may: Help with pain. Help with itching. Help with irritation and swelling. Help you get better sooner. Help to prevent long-term problems. If the rash is on your face, you may need to see an eye doctor or an ear, nose, and throat (ENT) doctor. Follow these instructions at home: Medicines Take your medicines only as told by your provider. Put an anti-itch cream or numbing cream on the rash or blisters as told by your provider. Relieving itching and discomfort  To help with itching: Put cold, wet cloths called cold compresses on the rash or blisters. Take a cool bath. Try adding baking soda or dry oatmeal to the water. Do not bathe in hot water. Use calamine lotion on the rash or blisters. You can get this type of lotion at the store. Blister and rash care Keep your rash covered with a loose bandage. Wear loose clothes that don't rub on your rash. Take care of your rash as told by your provider. Make sure you: Wash your hands with soap and water for at least 20 seconds before and after you change your bandage. If you can't use soap and water, use hand sanitizer. Keep your rash and blisters clean by washing them with mild soap and cool water. Change your bandage. Check your rash every day for signs of infection. Check for: More redness, swelling, or pain. Fluid or blood. Warmth. Pus or a bad  smell. Do not scratch your rash. Do not pick at your blisters. To help you not scratch: Keep your fingernails clean and cut short. Try to wear gloves or mittens when you sleep. General instructions Rest. Wash your hands often with soap and water for at least 20 seconds. If you can't use soap and water, use hand sanitizer. Washing your hands lowers your chance of getting a skin infection. Your infection can cause chickenpox in others. If you have blisters that aren't scabs yet, stay away from: Babies. Pregnant  people. Children who have eczema. Older people who have organ transplants. People who have a long-term, or chronic, illness. Anyone who hasn't had chickenpox before. Anyone who hasn't gotten the chickenpox vaccine. How is this prevented? Vaccines are the best way to prevent you from getting chickenpox or shingles. Talk with your provider about getting these shots. Where to find more information Centers for Disease Control and Prevention (CDC): [employer] Contact a health care provider if: Your pain doesn't get better with medicine. Your pain doesn't get better after the rash heals. You have any signs of infection around the rash. Your rash or blisters get worse. You have a fever or chills. Get help right away if: The rash is on your face or nose. You have pain in your face or by your eye. You lose feeling on one side of your face. You have trouble seeing. You have ear pain or ringing in your ear. This information is not intended to replace advice given to you by your health care provider. Make sure you discuss any questions you have with your health care provider. Document Revised: 01/10/2023 Document Reviewed: 05/25/2022 [employer] Patient Education  2024 [employer].

## 2023-05-17 ENCOUNTER — Ambulatory Visit: Payer: BC Managed Care – PPO

## 2023-05-29 ENCOUNTER — Ambulatory Visit
Admission: RE | Admit: 2023-05-29 | Discharge: 2023-05-29 | Disposition: A | Discharge status: Home / Self Care | Payer: BC Managed Care – PPO | Source: Ambulatory Visit | Attending: Obstetrics and Gynecology | Admitting: Obstetrics and Gynecology

## 2023-05-29 DIAGNOSIS — Z1231 Encounter for screening mammogram for malignant neoplasm of breast: Secondary | ICD-10-CM

## 2023-07-16 ENCOUNTER — Ambulatory Visit: Payer: BC Managed Care – PPO

## 2023-08-13 DIAGNOSIS — Z130 Encounter for screening for diseases of the blood and blood-forming organs and certain disorders involving the immune mechanism: Secondary | ICD-10-CM | POA: Diagnosis not present

## 2023-08-13 DIAGNOSIS — Z01419 Encounter for gynecological examination (general) (routine) without abnormal findings: Secondary | ICD-10-CM | POA: Diagnosis not present

## 2023-10-02 DIAGNOSIS — H52223 Regular astigmatism, bilateral: Secondary | ICD-10-CM | POA: Diagnosis not present

## 2023-10-02 DIAGNOSIS — H524 Presbyopia: Secondary | ICD-10-CM | POA: Diagnosis not present

## 2023-10-02 DIAGNOSIS — H5203 Hypermetropia, bilateral: Secondary | ICD-10-CM | POA: Diagnosis not present

## 2023-10-02 DIAGNOSIS — H04123 Dry eye syndrome of bilateral lacrimal glands: Secondary | ICD-10-CM | POA: Diagnosis not present

## 2023-12-04 ENCOUNTER — Other Ambulatory Visit: Payer: Self-pay | Admitting: Obstetrics and Gynecology

## 2023-12-04 DIAGNOSIS — R92333 Mammographic heterogeneous density, bilateral breasts: Secondary | ICD-10-CM

## 2024-01-07 ENCOUNTER — Ambulatory Visit
Admission: RE | Admit: 2024-01-07 | Discharge: 2024-01-07 | Disposition: A | Discharge status: Home / Self Care | Source: Ambulatory Visit | Attending: Obstetrics and Gynecology | Admitting: Obstetrics and Gynecology

## 2024-01-07 DIAGNOSIS — R92333 Mammographic heterogeneous density, bilateral breasts: Secondary | ICD-10-CM

## 2024-01-07 MED ORDER — GADOPICLENOL 0.5 MMOL/ML IV SOLN
6.0000 mL | Freq: Once | INTRAVENOUS | Status: AC | PRN
  Administered 2024-01-07: 6 mL via INTRAVENOUS

## 2024-01-29 DIAGNOSIS — Z85828 Personal history of other malignant neoplasm of skin: Secondary | ICD-10-CM | POA: Diagnosis not present

## 2024-01-29 DIAGNOSIS — L565 Disseminated superficial actinic porokeratosis (DSAP): Secondary | ICD-10-CM | POA: Diagnosis not present

## 2024-01-29 DIAGNOSIS — D225 Melanocytic nevi of trunk: Secondary | ICD-10-CM | POA: Diagnosis not present

## 2024-01-29 DIAGNOSIS — D485 Neoplasm of uncertain behavior of skin: Secondary | ICD-10-CM | POA: Diagnosis not present

## 2024-01-29 DIAGNOSIS — L814 Other melanin hyperpigmentation: Secondary | ICD-10-CM | POA: Diagnosis not present

## 2024-01-29 DIAGNOSIS — L570 Actinic keratosis: Secondary | ICD-10-CM | POA: Diagnosis not present

## 2024-05-04 ENCOUNTER — Encounter: Payer: Self-pay | Admitting: Obstetrics and Gynecology

## 2024-05-04 DIAGNOSIS — Z1231 Encounter for screening mammogram for malignant neoplasm of breast: Secondary | ICD-10-CM

## 2024-05-06 ENCOUNTER — Encounter: Payer: BC Managed Care – PPO | Admitting: Nurse Practitioner

## 2024-06-16 ENCOUNTER — Encounter: Admitting: Nurse Practitioner
